# Patient Record
Sex: Male | Born: 1980 | Race: White | Hispanic: No | Marital: Single | Smoking: Current every day smoker
Health system: Southern US, Community
[De-identification: ages and names within clinical notes are randomized; demographics above are authoritative.]

## PROBLEM LIST (undated history)

## (undated) DIAGNOSIS — F32A Depression, unspecified: Secondary | ICD-10-CM

## (undated) DIAGNOSIS — R202 Paresthesia of skin: Secondary | ICD-10-CM

## (undated) DIAGNOSIS — G4700 Insomnia, unspecified: Secondary | ICD-10-CM

## (undated) DIAGNOSIS — F419 Anxiety disorder, unspecified: Secondary | ICD-10-CM

## (undated) DIAGNOSIS — M60062 Infective myositis, left lower leg: Secondary | ICD-10-CM

## (undated) DIAGNOSIS — F1910 Other psychoactive substance abuse, uncomplicated: Secondary | ICD-10-CM

## (undated) DIAGNOSIS — B199 Unspecified viral hepatitis without hepatic coma: Secondary | ICD-10-CM

## (undated) HISTORY — DX: Depression, unspecified: F32.A

## (undated) HISTORY — DX: Anxiety disorder, unspecified: F41.9

## (undated) HISTORY — DX: Paresthesia of skin: R20.2

## (undated) HISTORY — DX: Infective myositis, left lower leg: M60.062

## (undated) HISTORY — DX: Insomnia, unspecified: G47.00

---

## 2007-09-21 ENCOUNTER — Emergency Department (HOSPITAL_COMMUNITY): Admission: EM | Admit: 2007-09-21 | Discharge: 2007-09-21 | Payer: Self-pay | Admitting: Emergency Medicine

## 2011-01-07 ENCOUNTER — Emergency Department (HOSPITAL_COMMUNITY)
Admission: EM | Admit: 2011-01-07 | Discharge: 2011-01-07 | Disposition: A | Discharge status: Home / Self Care | Payer: No Typology Code available for payment source | Attending: Emergency Medicine | Admitting: Emergency Medicine

## 2011-01-07 ENCOUNTER — Emergency Department (HOSPITAL_COMMUNITY): Payer: Self-pay

## 2011-01-07 DIAGNOSIS — R112 Nausea with vomiting, unspecified: Secondary | ICD-10-CM | POA: Insufficient documentation

## 2011-01-07 DIAGNOSIS — Z79899 Other long term (current) drug therapy: Secondary | ICD-10-CM | POA: Insufficient documentation

## 2011-01-07 DIAGNOSIS — R197 Diarrhea, unspecified: Secondary | ICD-10-CM | POA: Insufficient documentation

## 2011-01-07 DIAGNOSIS — R404 Transient alteration of awareness: Secondary | ICD-10-CM | POA: Insufficient documentation

## 2011-01-07 DIAGNOSIS — R413 Other amnesia: Secondary | ICD-10-CM | POA: Insufficient documentation

## 2011-01-07 DIAGNOSIS — R61 Generalized hyperhidrosis: Secondary | ICD-10-CM | POA: Insufficient documentation

## 2011-01-07 LAB — POCT I-STAT, CHEM 8
BUN: 17 mg/dL (ref 6–23)
Creatinine, Ser: 1 mg/dL (ref 0.50–1.35)
Hemoglobin: 15 g/dL (ref 13.0–17.0)
Potassium: 3.5 mEq/L (ref 3.5–5.1)
Sodium: 142 mEq/L (ref 135–145)
TCO2: 24 mmol/L (ref 0–100)

## 2011-01-07 LAB — CBC
Hemoglobin: 14.1 g/dL (ref 13.0–17.0)
MCH: 30.3 pg (ref 26.0–34.0)
MCHC: 34.5 g/dL (ref 30.0–36.0)
Platelets: 192 10*3/uL (ref 150–400)
RBC: 4.65 MIL/uL (ref 4.22–5.81)

## 2011-01-07 LAB — CK TOTAL AND CKMB (NOT AT ARMC)
CK, MB: 2.4 ng/mL (ref 0.3–4.0)
Relative Index: 2.3 (ref 0.0–2.5)
Total CK: 105 U/L (ref 7–232)

## 2011-04-04 LAB — COMPREHENSIVE METABOLIC PANEL
AST: 30
Albumin: 4.1
BUN: 10
Calcium: 9.2
Creatinine, Ser: 1.16
GFR calc Af Amer: >60
Total Protein: 6.5

## 2011-04-04 LAB — DIFFERENTIAL
Basophils Absolute: 0.1
Lymphocytes Relative: 10 — ABNORMAL LOW
Lymphs Abs: 1.1
Monocytes Absolute: 1
Monocytes Relative: 9
Neutro Abs: 8.9 — ABNORMAL HIGH

## 2011-04-04 LAB — CBC
HCT: 41.7
MCV: 91.2
Platelets: 208
RDW: 12.6

## 2011-04-04 LAB — RAPID URINE DRUG SCREEN, HOSP PERFORMED
Amphetamines: NOT DETECTED
Benzodiazepines: NOT DETECTED
Cocaine: NOT DETECTED
Tetrahydrocannabinol: POSITIVE — AB

## 2019-12-25 ENCOUNTER — Inpatient Hospital Stay (HOSPITAL_COMMUNITY)
Admission: EM | Admit: 2019-12-25 | Discharge: 2019-12-30 | DRG: 871 | Disposition: A | Discharge status: Home / Self Care | Payer: Self-pay | Source: Other Acute Inpatient Hospital | Attending: Internal Medicine | Admitting: Internal Medicine

## 2019-12-25 ENCOUNTER — Other Ambulatory Visit: Payer: Self-pay

## 2019-12-25 ENCOUNTER — Encounter (HOSPITAL_COMMUNITY): Payer: Self-pay | Admitting: Family Medicine

## 2019-12-25 DIAGNOSIS — J189 Pneumonia, unspecified organism: Secondary | ICD-10-CM | POA: Diagnosis present

## 2019-12-25 DIAGNOSIS — E871 Hypo-osmolality and hyponatremia: Secondary | ICD-10-CM | POA: Diagnosis present

## 2019-12-25 DIAGNOSIS — F1120 Opioid dependence, uncomplicated: Secondary | ICD-10-CM | POA: Diagnosis present

## 2019-12-25 DIAGNOSIS — R748 Abnormal levels of other serum enzymes: Secondary | ICD-10-CM

## 2019-12-25 DIAGNOSIS — J9601 Acute respiratory failure with hypoxia: Secondary | ICD-10-CM | POA: Diagnosis present

## 2019-12-25 DIAGNOSIS — R7401 Elevation of levels of liver transaminase levels: Secondary | ICD-10-CM

## 2019-12-25 DIAGNOSIS — Z888 Allergy status to other drugs, medicaments and biological substances status: Secondary | ICD-10-CM

## 2019-12-25 DIAGNOSIS — J188 Other pneumonia, unspecified organism: Secondary | ICD-10-CM | POA: Diagnosis present

## 2019-12-25 DIAGNOSIS — A419 Sepsis, unspecified organism: Principal | ICD-10-CM | POA: Diagnosis present

## 2019-12-25 DIAGNOSIS — M60004 Infective myositis, unspecified left leg: Secondary | ICD-10-CM | POA: Diagnosis present

## 2019-12-25 DIAGNOSIS — D649 Anemia, unspecified: Secondary | ICD-10-CM | POA: Diagnosis not present

## 2019-12-25 DIAGNOSIS — M60009 Infective myositis, unspecified site: Secondary | ICD-10-CM | POA: Diagnosis present

## 2019-12-25 DIAGNOSIS — F1910 Other psychoactive substance abuse, uncomplicated: Secondary | ICD-10-CM | POA: Diagnosis present

## 2019-12-25 DIAGNOSIS — F17210 Nicotine dependence, cigarettes, uncomplicated: Secondary | ICD-10-CM | POA: Diagnosis present

## 2019-12-25 DIAGNOSIS — E876 Hypokalemia: Secondary | ICD-10-CM | POA: Diagnosis not present

## 2019-12-25 DIAGNOSIS — M79605 Pain in left leg: Secondary | ICD-10-CM

## 2019-12-25 DIAGNOSIS — Z79899 Other long term (current) drug therapy: Secondary | ICD-10-CM

## 2019-12-25 DIAGNOSIS — R7989 Other specified abnormal findings of blood chemistry: Secondary | ICD-10-CM | POA: Diagnosis present

## 2019-12-25 DIAGNOSIS — Z20822 Contact with and (suspected) exposure to covid-19: Secondary | ICD-10-CM | POA: Diagnosis present

## 2019-12-25 HISTORY — DX: Opioid dependence, uncomplicated: F11.20

## 2019-12-25 HISTORY — DX: Elevation of levels of liver transaminase levels: R74.01

## 2019-12-25 HISTORY — DX: Unspecified viral hepatitis without hepatic coma: B19.9

## 2019-12-25 HISTORY — DX: Other psychoactive substance abuse, uncomplicated: F19.10

## 2019-12-25 LAB — LACTIC ACID, PLASMA: Lactic Acid, Venous: 1.6 mmol/L (ref 0.5–1.9)

## 2019-12-25 MED ORDER — HYDROMORPHONE HCL 1 MG/ML IJ SOLN
0.5000 mg | INTRAMUSCULAR | Status: DC | PRN
  Filled 2019-12-25: qty 0.5

## 2019-12-25 MED ORDER — HYDROMORPHONE HCL 1 MG/ML IJ SOLN: 1.0000 mg | Freq: Once | INTRAMUSCULAR | Status: DC | PRN

## 2019-12-25 MED ORDER — BUPRENORPHINE HCL-NALOXONE HCL 8-2 MG SL SUBL
1.0000 | SUBLINGUAL_TABLET | Freq: Two times a day (BID) | SUBLINGUAL | Status: DC
  Administered 2019-12-27 – 2019-12-30 (×7): 1 via SUBLINGUAL
  Filled 2019-12-25 (×7): qty 1

## 2019-12-25 MED ORDER — SODIUM CHLORIDE 0.9 % IV SOLN: INTRAVENOUS | Status: AC

## 2019-12-25 MED ORDER — ONDANSETRON HCL 4 MG/2ML IJ SOLN
4.0000 mg | Freq: Four times a day (QID) | INTRAMUSCULAR | Status: DC | PRN
  Administered 2019-12-28 – 2019-12-29 (×4): 4 mg via INTRAVENOUS
  Filled 2019-12-25 (×4): qty 2

## 2019-12-25 MED ORDER — ONDANSETRON HCL 4 MG PO TABS
4.0000 mg | ORAL_TABLET | Freq: Four times a day (QID) | ORAL | Status: DC | PRN
  Administered 2019-12-28 – 2019-12-30 (×3): 4 mg via ORAL
  Filled 2019-12-25 (×3): qty 1

## 2019-12-25 MED ORDER — HYDROMORPHONE HCL 1 MG/ML IJ SOLN
0.5000 mg | INTRAMUSCULAR | Status: DC | PRN
  Administered 2019-12-25: 0.5 mg via INTRAVENOUS
  Administered 2019-12-26 (×6): 1 mg via INTRAVENOUS
  Filled 2019-12-25 (×6): qty 1

## 2019-12-25 MED ORDER — PIPERACILLIN-TAZOBACTAM 3.375 G IVPB
3.3750 g | Freq: Three times a day (TID) | INTRAVENOUS | Status: DC
  Administered 2019-12-25 – 2019-12-30 (×14): 3.375 g via INTRAVENOUS
  Filled 2019-12-25 (×13): qty 50

## 2019-12-25 MED ORDER — ENOXAPARIN SODIUM 40 MG/0.4ML ~~LOC~~ SOLN
40.0000 mg | SUBCUTANEOUS | Status: DC
  Administered 2019-12-25 – 2019-12-26 (×2): 40 mg via SUBCUTANEOUS
  Filled 2019-12-25 (×4): qty 0.4

## 2019-12-25 MED ORDER — MIRTAZAPINE 15 MG PO TABS
30.0000 mg | ORAL_TABLET | Freq: Every day | ORAL | Status: DC
  Administered 2019-12-25 – 2019-12-29 (×5): 30 mg via ORAL
  Filled 2019-12-25 (×5): qty 2

## 2019-12-25 MED ORDER — BUSPIRONE HCL 10 MG PO TABS
20.0000 mg | ORAL_TABLET | Freq: Two times a day (BID) | ORAL | Status: DC
  Administered 2019-12-25 – 2019-12-30 (×10): 20 mg via ORAL
  Filled 2019-12-25: qty 4
  Filled 2019-12-25 (×10): qty 2

## 2019-12-25 MED ORDER — SENNOSIDES-DOCUSATE SODIUM 8.6-50 MG PO TABS: 1.0000 | ORAL_TABLET | Freq: Every evening | ORAL | Status: DC | PRN

## 2019-12-25 MED ORDER — ACETAMINOPHEN 650 MG RE SUPP: 650.0000 mg | Freq: Four times a day (QID) | RECTAL | Status: DC | PRN

## 2019-12-25 MED ORDER — VANCOMYCIN HCL IN DEXTROSE 1-5 GM/200ML-% IV SOLN
1000.0000 mg | Freq: Three times a day (TID) | INTRAVENOUS | Status: DC
  Administered 2019-12-26 – 2019-12-27 (×4): 1000 mg via INTRAVENOUS
  Filled 2019-12-25 (×6): qty 200

## 2019-12-25 MED ORDER — SODIUM CHLORIDE 0.9% FLUSH
3.0000 mL | Freq: Two times a day (BID) | INTRAVENOUS | Status: DC
  Administered 2019-12-25 – 2019-12-30 (×7): 3 mL via INTRAVENOUS

## 2019-12-25 MED ORDER — ACETAMINOPHEN 325 MG PO TABS
650.0000 mg | ORAL_TABLET | Freq: Four times a day (QID) | ORAL | Status: DC | PRN
  Administered 2019-12-26 – 2019-12-30 (×8): 650 mg via ORAL
  Filled 2019-12-25 (×9): qty 2

## 2019-12-25 MED ORDER — FLUOXETINE HCL 20 MG PO CAPS
40.0000 mg | ORAL_CAPSULE | Freq: Every morning | ORAL | Status: DC
  Administered 2019-12-26 – 2019-12-30 (×5): 40 mg via ORAL
  Filled 2019-12-25 (×5): qty 2

## 2019-12-25 NOTE — Progress Notes (Signed)
Pharmacy Antibiotic Note  Daniel Trujillo is a 39 y.o. male admitted on 12/25/2019 with Infectious myofascitis.  Pharmacy has been consulted for Vanco, Zosyn dosing.  CC/HPI: Transfer from [place] c/o L knee pain with swelling and redness. Noted AST 1432, ALT 336, Tbili 1.2, LA 3, PC 1.78, Ddimer 3274 - CT at [facility]: Infectious myofascitis  PMH: depression, anxiety, substance use disorder, tobacco  Significant events:  Home meds: Buspar, Prozac, Remeron  ID: Infectious myofascitis.  WBC 17.3. Scr 0.9. PC 1.78 at [place]. LA 3 elevated.   Plan: Vanco 2g IV 1549 at OSH, then 1g IV q8hr. Vanco trough after 3-5 doses at steady state. Zosyn 3.375g IV 1509, then 3.375g IV q8hr      Height: 6\' 2"  (188 cm) Weight: 77.7 kg (171 lb 3.2 oz) IBW/kg (Calculated) : 82.2  Temp (24hrs), Avg:99.8 F (37.7 C), Min:99.8 F (37.7 C), Max:99.8 F (37.7 C)  No results for input(s): WBC, CREATININE, LATICACIDVEN, VANCOTROUGH, VANCOPEAK, VANCORANDOM, GENTTROUGH, GENTPEAK, GENTRANDOM, TOBRATROUGH, TOBRAPEAK, TOBRARND, AMIKACINPEAK, AMIKACINTROU, AMIKACIN in the last 168 hours.  CrCl cannot be calculated (Patient's most recent lab result is older than the maximum 21 days allowed.).    Allergies  Allergen Reactions  . Naproxen Rash and Swelling    Other reaction(s): Joint Pain  . Carbamazepine Hives    Tene Gato S. , PharmD, BCPS Clinical Staff Pharmacist [email or web address] Merilynn [place] 12/25/2019 10:02 PM

## 2019-12-25 NOTE — Progress Notes (Signed)
Pt has arrived to the unit from [facility].  Pt complains of pain 9 out of 10.   [facility] reports last pain medication administered at 1920 by RN at [place] prior to transport.

## 2019-12-25 NOTE — H&P (Addendum)
History and Physical    Daniel Trujillo [id number] DOB: July 24, 1980 DOA: 12/25/2019  PCP: Carlus Pavlov, MD   Patient coming from: Home   Chief Complaint: Fever, left leg redness/pain/swelling  HPI: Daniel Trujillo is a 39 y.o. male with medical history significant for IV drug abuse and opiate dependence on Suboxone, now presenting to emergency department for evaluation of fevers with pain, redness, and swelling involving the left leg.  Patient reports he been in his usual state of health until this morning when he noticed pain in the back of his left leg.  He noticed redness and swelling behind the left knee, denies any recent trauma or precipitating event that he can identify.  Reports fever at home for which he took acetaminophen.  Denies any chest pain or cough.  Reports that he injected heroin 2 days ago but is trying to quit.  Reports that he has a history of hepatitis that was treated around 2018 and he denies any recent abdominal pain, vomiting, or diarrhea.  [facility] ED Course: Upon arrival to the ED, patient is found to be febrile, tachycardic in the 120s, stable blood pressure, and new supplemental oxygen requirement.  Chest x-ray is notable for new infiltrates involving the right lower lobe and likely left upper lobe.  CT of the left lower extremity demonstrates patchy hypodense appearance of the distal semimembranosus and proximal gastrocnemius musculature with inflammatory stranding but no abscess or air.  Chemistry panel features a sodium of 130, elevated BUN to creatinine ratio, and elevated transaminases.  CBC with leukocytosis to 17,300.  Lactic acid is elevated to 3.0, CRP elevated to 225, procalcitonin elevated to 1.78, and D-dimer is elevated at 340.  UDS was positive for opiates.  Patient was given 2 L of saline, pain control, vancomycin, and Zosyn in the ED.  Orthopedic surgery was consulted and evaluated the patient in the emergency department, recommended hospitalist  admission for IV antibiotics and close monitoring.  Hospitalist at [place] recommended transfer to a facility with an ICU in case the patient worsens.  He was transferred to [facility] for ongoing evaluation and management. COVID-19 screening test was negative at [facility] prior to transfer.   Review of Systems:  All other systems reviewed and apart from HPI, are negative.  Past Medical History:  Diagnosis Date  . IV drug abuse ([place])   . Viral hepatitis     History reviewed. No pertinent surgical history.   reports that he has been smoking. He has never used smokeless tobacco. He reports previous alcohol use. He reports current drug use. Drug: Heroin.  Allergies  Allergen Reactions  . Naproxen Rash and Swelling    Other reaction(s): Joint Pain  . Carbamazepine Hives    Family History  Problem Relation Age of Onset  . Hypertension Mother   . Stroke Maternal Grandmother      Prior to Admission medications   Medication Sig Start Date End Date Taking? Authorizing Provider  buprenorphine-naloxone (SUBOXONE) 8-2 mg SUBL SL tablet Place 1 tablet under the tongue 2 (two) times daily. 10/23/19  Yes [provider]  busPIRone (BUSPAR) 10 MG tablet Take 20 mg by mouth 2 (two) times daily. 11/25/19  Yes [provider]  FLUoxetine (PROZAC) 40 MG capsule Take 40 mg by mouth every morning. 11/25/19  Yes [provider]  mirtazapine (REMERON) 30 MG tablet Take 30 mg by mouth at bedtime. 11/25/19  Yes [provider]    Physical Exam: Vitals:   12/25/19 2045  BP:  140/80  Pulse: 95  Resp: 16  Temp: 99.8 F (37.7 C)  TempSrc: Oral  SpO2: 93%  Weight: 77.7 kg  Height: 6\' 2"  (1.88 m)    Constitutional: NAD, calm  Eyes: PERTLA, lids and conjunctivae normal ENMT: Mucous membranes are moist. Posterior pharynx clear of any exudate or lesions.   Neck: normal, supple, no masses, no thyromegaly Respiratory: no wheezing, no crackles. Mild tachypnea. No  accessory muscle use.  Cardiovascular: Rate ~110 and regular. No extremity edema.  Abdomen: No distension, no tenderness, soft. Bowel sounds active.  Musculoskeletal: no clubbing / cyanosis. No joint deformity upper and lower extremities.   Skin: Popliteal fossa with erythema, tenderness, and warmth but no fluctuance, drainage, or crepitus. Warm, dry, well-perfused. Neurologic: CN 2-12 grossly intact. Sensation intact. Moving all extremities.  Psychiatric: Alert and oriented to person, place, and situation. Calm and cooperative.    Labs and Imaging on Admission: I have personally reviewed following labs and imaging studies  CBC: No results for input(s): WBC, NEUTROABS, HGB, HCT, MCV, PLT in the last 168 hours. Basic Metabolic Panel: No results for input(s): NA, K, CL, CO2, GLUCOSE, BUN, CREATININE, CALCIUM, MG, PHOS in the last 168 hours. GFR: CrCl cannot be calculated (Patient's most recent lab result is older than the maximum 21 days allowed.). Liver Function Tests: No results for input(s): AST, ALT, ALKPHOS, BILITOT, PROT, ALBUMIN in the last 168 hours. No results for input(s): LIPASE, AMYLASE in the last 168 hours. No results for input(s): AMMONIA in the last 168 hours. Coagulation Profile: No results for input(s): INR, PROTIME in the last 168 hours. Cardiac Enzymes: No results for input(s): CKTOTAL, CKMB, CKMBINDEX, TROPONINI in the last 168 hours. BNP (last 3 results) No results for input(s): PROBNP in the last 8760 hours. HbA1C: No results for input(s): HGBA1C in the last 72 hours. CBG: No results for input(s): GLUCAP in the last 168 hours. Lipid Profile: No results for input(s): CHOL, HDL, LDLCALC, TRIG, CHOLHDL, LDLDIRECT in the last 72 hours. Thyroid Function Tests: No results for input(s): TSH, T4TOTAL, FREET4, T3FREE, THYROIDAB in the last 72 hours. Anemia Panel: No results for input(s): VITAMINB12, FOLATE, FERRITIN, TIBC, IRON, RETICCTPCT in the last 72 hours. Urine  analysis: No results found for: COLORURINE, APPEARANCEUR, LABSPEC, PHURINE, GLUCOSEU, HGBUR, BILIRUBINUR, KETONESUR, PROTEINUR, UROBILINOGEN, NITRITE, LEUKOCYTESUR Sepsis Labs: @LABRCNTIP (procalcitonin:4,lacticidven:4) )No results found for this or any previous visit (from the past 240 hour(s)).   Radiological Exams on Admission: No results found.   Assessment/Plan   1. Sepsis secondary to soft tissue infection  - IV drug abuser presents with one day of fever with pain, swelling, and erythema involving left popliteal fossa and extending proximally  - He was found to be febrile with tachycardia, leukocytosis, and lactate of 3.0  - CT findings concerning for myofasciitis involving semimembranous and proximal gastrocnemius muscles without abscess or gas  - He was given 2 liters NS in ED and started on vancomycin and Zosyn  - Orthopedic surgery saw the patient at [facility] and recommended IV antibiotics and close observation but hospitalist there recommended transfer to facility with an ICU should he worsen  - Continue vancomycin and Zosyn, follow cultures, trend inflammatory markers    2. Acute hypoxic respiratory failure; multifocal pneumonia; elevated d-dimer  - IV drug abuser presenting with fevers and pain/swelling/redness involving LLE and is found to have new supplemental O2 requirement, multifocal infiltrates on CXR, and d-dimer 3.4  - He denies cough or chest pain  - CTA chest to better  characterize CXR findings and rule-out PE, check sputum culture, continue broad-spectrum antibiotics, follow blood cultures, consider echo if blood cultures positive or CTA findings concerning for septic emboli   3. Elevated transaminases  - AST 1400 and ALT 340 in ED  - No abdominal pain or GI complaints, reports being treated for viral hepatitis in 2018  - Check acute viral hepatitis panel, trend levels    4. IV drug abuse; opiate dependence  - Patient reports taking Suboxone and trying to quit  heroin (last injected 12/23/19)  - Continue Suboxone  - He is admitted with an acutely painful condition and is aware that pain-control will be difficult due to his opiate tolerance and allergy to NSAID     DVT prophylaxis: Lovenox  Code Status: Full  Family Communication: Discussed with patient  Disposition Plan:  Patient is from: Home  Anticipated d/c is to: Home  Anticipated d/c date is: 12/28/19 Patient currently: Has sepsis secondary to soft-tissue infection requiring IV antibiotics and close monitoring  Consults called: None  Admission status: Inpatient    Briscoe Deutscher, MD [facility] Pager: See [email or web address]  If 7AM-7PM, please contact the daytime attending [email or web address]  12/25/2019, 10:25 PM

## 2019-12-26 ENCOUNTER — Inpatient Hospital Stay (HOSPITAL_COMMUNITY): Payer: Self-pay

## 2019-12-26 DIAGNOSIS — R748 Abnormal levels of other serum enzymes: Secondary | ICD-10-CM

## 2019-12-26 DIAGNOSIS — J189 Pneumonia, unspecified organism: Secondary | ICD-10-CM

## 2019-12-26 DIAGNOSIS — M60004 Infective myositis, unspecified left leg: Secondary | ICD-10-CM

## 2019-12-26 DIAGNOSIS — F1910 Other psychoactive substance abuse, uncomplicated: Secondary | ICD-10-CM

## 2019-12-26 LAB — COMPREHENSIVE METABOLIC PANEL
ALT: 254 U/L — ABNORMAL HIGH (ref 0–44)
AST: 687 U/L — ABNORMAL HIGH (ref 15–41)
Albumin: 2.8 g/dL — ABNORMAL LOW (ref 3.5–5.0)
Alkaline Phosphatase: 58 U/L (ref 38–126)
Anion gap: 7 (ref 5–15)
BUN: 9 mg/dL (ref 6–20)
CO2: 28 mmol/L (ref 22–32)
Calcium: 8.2 mg/dL — ABNORMAL LOW (ref 8.9–10.3)
Chloride: 101 mmol/L (ref 98–111)
Creatinine, Ser: 1.04 mg/dL (ref 0.61–1.24)
GFR calc Af Amer: >60 mL/min (ref >60)
GFR calc non Af Amer: >60 mL/min (ref >60)
Glucose, Bld: 132 mg/dL — ABNORMAL HIGH (ref 70–99)
Potassium: 3.6 mmol/L (ref 3.5–5.1)
Sodium: 136 mmol/L (ref 135–145)
Total Bilirubin: 0.9 mg/dL (ref 0.3–1.2)
Total Protein: 6.1 g/dL — ABNORMAL LOW (ref 6.5–8.1)

## 2019-12-26 LAB — CBC WITH DIFFERENTIAL/PLATELET
Abs Immature Granulocytes: 0.05 10*3/uL (ref 0.00–0.07)
Basophils Absolute: 0 10*3/uL (ref 0.0–0.1)
Basophils Relative: 0 %
Eosinophils Absolute: 0 10*3/uL (ref 0.0–0.5)
Eosinophils Relative: 0 %
HCT: 35.6 % — ABNORMAL LOW (ref 39.0–52.0)
Hemoglobin: 12 g/dL — ABNORMAL LOW (ref 13.0–17.0)
Immature Granulocytes: 0 %
Lymphocytes Relative: 12 %
Lymphs Abs: 1.5 10*3/uL (ref 0.7–4.0)
MCH: 30.5 pg (ref 26.0–34.0)
MCHC: 33.7 g/dL (ref 30.0–36.0)
MCV: 90.6 fL (ref 80.0–100.0)
Monocytes Absolute: 1.4 10*3/uL — ABNORMAL HIGH (ref 0.1–1.0)
Monocytes Relative: 11 %
Neutro Abs: 9.9 10*3/uL — ABNORMAL HIGH (ref 1.7–7.7)
Neutrophils Relative %: 77 %
Platelets: 179 10*3/uL (ref 150–400)
RBC: 3.93 MIL/uL — ABNORMAL LOW (ref 4.22–5.81)
RDW: 12.9 % (ref 11.5–15.5)
WBC: 12.9 10*3/uL — ABNORMAL HIGH (ref 4.0–10.5)
nRBC: 0 % (ref 0.0–0.2)

## 2019-12-26 LAB — PROTIME-INR
INR: 1.1 (ref 0.8–1.2)
Prothrombin Time: 14.1 seconds (ref 11.4–15.2)

## 2019-12-26 LAB — EXPECTORATED SPUTUM ASSESSMENT W GRAM STAIN, RFLX TO RESP C

## 2019-12-26 LAB — STREP PNEUMONIAE URINARY ANTIGEN: Strep Pneumo Urinary Antigen: NEGATIVE

## 2019-12-26 LAB — SEDIMENTATION RATE: Sed Rate: 46 mm/hr — ABNORMAL HIGH (ref 0–16)

## 2019-12-26 LAB — HEPATITIS PANEL, ACUTE
HCV Ab: REACTIVE — AB
Hep A IgM: NONREACTIVE
Hep B C IgM: NONREACTIVE
Hepatitis B Surface Ag: NONREACTIVE

## 2019-12-26 LAB — C-REACTIVE PROTEIN: CRP: 20.5 mg/dL — ABNORMAL HIGH (ref <1.0)

## 2019-12-26 LAB — HIV ANTIBODY (ROUTINE TESTING W REFLEX): HIV Screen 4th Generation wRfx: NONREACTIVE

## 2019-12-26 LAB — SARS CORONAVIRUS 2 BY RT PCR (HOSPITAL ORDER, PERFORMED IN ~~LOC~~ HOSPITAL LAB): SARS Coronavirus 2: NEGATIVE

## 2019-12-26 MED ORDER — NICOTINE 14 MG/24HR TD PT24
14.0000 mg | MEDICATED_PATCH | Freq: Every day | TRANSDERMAL | Status: DC
  Administered 2019-12-26 – 2019-12-30 (×5): 14 mg via TRANSDERMAL
  Filled 2019-12-26 (×5): qty 1

## 2019-12-26 MED ORDER — IOHEXOL 350 MG/ML SOLN
75.0000 mL | Freq: Once | INTRAVENOUS | Status: AC | PRN
  Administered 2019-12-26: 75 mL via INTRAVENOUS

## 2019-12-26 NOTE — Plan of Care (Signed)
°  Problem: Coping: °Goal: Level of anxiety will decrease °Outcome: Progressing °  °

## 2019-12-26 NOTE — Progress Notes (Signed)
PROGRESS NOTE    Daniel Trujillo  YSA:[id number] DOB: January 12, 1981 DOA: 12/25/2019 PCP: Carlus Pavlov, MD   No chief complaint on file.   Brief Narrative:  39 year old male prior history of IV drug abuse, opiate dependence on Suboxone presents to ED for evaluation of fever associated with pain and redness of the left lower extremity.  Patient was seen at [facility] center and transferred to [facility] for further evaluation and management of myositis.  CT of the left lower extremity demonstrate patchy hypodense appearance of the distal semimembranous and proximal gastrocnemius musculature with inflammatory stranding without any abscess or air.  CT angiogram of the chest shows groundglass opacities in the bilateral lungs consistent with atypical pneumonia. Covid test was negative.  Patient was started on broad-spectrum IV antibiotics.   Assessment & Plan:   Principal Problem:   Infectious myositis Active Problems:   Sepsis ([place])   Multifocal pneumonia   IV drug abuse ([place])   Opiate dependence ([place])   Elevated liver transaminase level   Positive D dimer   Hyponatremia  Sepsis secondary to myofascitis involving semimembranosus and proximal gastrocnemius muscles, present on admission. In the setting of IV drug abuse. Patient was febrile on admission with elevated lactic acid, tachycardic and leukocytosis.  Sepsis physiology improving.  Patient is currently afebrile, improving leukocytosis, lactic acid has normalized. Continue with broad-spectrum IV antibiotics at this time and trend inflammatory markers.  Follow blood cultures.     Acute hypoxic respiratory failure with elevated D-dimer and CT angiogram suspicious for atypical pneumonia. Sputum cultures are pending. Continue broad-spectrum IV antibiotics. Follow blood cultures.    Elevated transaminases, no symptoms of nausea or vomiting or abdominal pain. Patient has a history of HCV, reports being treated in  2018.   History of IV drug abuse with opiate dependence on Suboxone. Continue with Suboxone.   DVT prophylaxis: Lovenox.  Code Status: full code.  Family Communication: none at bedside.  Disposition:   Status is: Inpatient  Remains inpatient appropriate because:IV treatments appropriate due to intensity of illness or inability to take PO   Dispo: The patient is from: Home              Anticipated d/c is to: Home              Anticipated d/c date is: 2 days              Patient currently is not medically stable to d/c.       Consultants:   None.    Procedures: none.   Antimicrobials:  Anti-infectives (From admission, onward)   Start     Dose/Rate Route Frequency Ordered Stop   12/26/19 0000  vancomycin (VANCOCIN) IVPB 1000 mg/200 mL premix     Discontinue     1,000 mg 200 mL/hr over 60 Minutes Intravenous Every 8 hours 12/25/19 2201     12/25/19 2300  piperacillin-tazobactam (ZOSYN) IVPB 3.375 g     Discontinue     3.375 g 12.5 mL/hr over 240 Minutes Intravenous Every 8 hours 12/25/19 2201            Subjective: Pain better controlled.   Objective: Vitals:   12/25/19 2045 12/26/19 0006 12/26/19 0330 12/26/19 0710  BP: 140/80 138/84 134/76 132/78  Pulse: 95 89 93 87  Resp: 16 14 16 18   Temp: 99.8 F (37.7 C) (!) 100.4 F (38 C) 99.9 F (37.7 C) 99.8 F (37.7 C)  TempSrc: Oral Oral Oral Oral  SpO2: 93%  100% 96% 96%  Weight: 77.7 kg  76.8 kg   Height: 6\' 2"  (1.88 m)       Intake/Output Summary (Last 24 hours) at 12/26/2019 1135 Last data filed at 12/26/2019 0807 Gross per 24 hour  Intake 1600.93 ml  Output 1400 ml  Net 200.93 ml   Filed Weights   12/25/19 2045 12/26/19 0330  Weight: 77.7 kg 76.8 kg    Examination:  General exam: Appears calm and comfortable  Respiratory system: Clear to auscultation. Respiratory effort normal. Cardiovascular system: S1 & S2 heard, RRR. No JVD, murmurs, rubs, gallops or clicks. No pedal  edema. Gastrointestinal system: Abdomen is nondistended, soft and nontender.Normal bowel sounds heard. Central nervous system: Alert and oriented. No focal neurological deficits. Extremities: erythema, tenderness and swelling over the inner aspect of the lleft thigh and back of the knee on the left.  Skin:  See above,  Psychiatry:  Mood & affect appropriate.     Data Reviewed: I have personally reviewed following labs and imaging studies  CBC: Recent Labs  Lab 12/26/19 0305  WBC 12.9*  NEUTROABS 9.9*  HGB 12.0*  HCT 35.6*  MCV 90.6  PLT 179    Basic Metabolic Panel: Recent Labs  Lab 12/26/19 0305  NA 136  K 3.6  CL 101  CO2 28  GLUCOSE 132*  BUN 9  CREATININE 1.04  CALCIUM 8.2*    GFR: Estimated Creatinine Clearance: 106.7 mL/min (by C-G formula based on SCr of 1.04 mg/dL).  Liver Function Tests: Recent Labs  Lab 12/26/19 0305  AST 687*  ALT 254*  ALKPHOS 58  BILITOT 0.9  PROT 6.1*  ALBUMIN 2.8*    CBG: No results for input(s): GLUCAP in the last 168 hours.   Recent Results (from the past 240 hour(s))  Culture, blood (routine x 2)     Status: None (Preliminary result)   Collection Time: 12/25/19 10:26 PM   Specimen: BLOOD LEFT HAND  Result Value Ref Range Status   Specimen Description BLOOD LEFT HAND  Final   Special Requests   Final    BOTTLES DRAWN AEROBIC AND ANAEROBIC Blood Culture adequate volume   Culture   Final    NO GROWTH < 12 HOURS Performed at [facility], [address] [place], [place]    Report Status PENDING  Incomplete  Culture, blood (routine x 2)     Status: None (Preliminary result)   Collection Time: 12/25/19 10:33 PM   Specimen: BLOOD  Result Value Ref Range Status   Specimen Description BLOOD RIGHT ANTECUBITAL  Final   Special Requests   Final    BOTTLES DRAWN AEROBIC AND ANAEROBIC Blood Culture adequate volume   Culture   Final    NO GROWTH < 12 HOURS Performed at [facility], [address] [place], [place]    Report Status PENDING  Incomplete  SARS Coronavirus 2 by RT PCR (hospital order, performed in [facility] hospital lab)     Status: None   Collection Time: 12/25/19 11:43 PM  Result Value Ref Range Status   SARS Coronavirus 2 NEGATIVE NEGATIVE Final    Comment: (NOTE) SARS-CoV-2 target nucleic acids are NOT DETECTED.  The SARS-CoV-2 RNA is generally detectable in upper and lower respiratory specimens during the acute phase of infection. The lowest concentration of SARS-CoV-2 viral copies this assay can detect is 250 copies / mL. A negative result does not preclude SARS-CoV-2 infection and should not be used  as the sole basis for treatment or other patient management decisions.  A negative result may occur with improper specimen collection / handling, submission of specimen other than nasopharyngeal swab, presence of viral mutation(s) within the areas targeted by this assay, and inadequate number of viral copies (<250 copies / mL). A negative result must be combined with clinical observations, patient history, and epidemiological information.  Fact Sheet for Patients:   BoilerBrush.com.cy  Fact Sheet for Healthcare Providers: https://pope.com/  This test is not yet approved or  cleared by the [place] FDA and has been authorized for detection and/or diagnosis of SARS-CoV-2 by FDA under an Emergency Use Authorization (EUA).  This EUA will remain in effect (meaning this test can be used) for the duration of the COVID-19 declaration under Section 564(b)(1) of the Act, 21 U.S.C. section 360bbb-3(b)(1), unless the authorization is terminated or revoked sooner.  Performed at [facility], [address] [place], [place] [address]          Radiology Studies: CT ANGIO CHEST PE W OR WO CONTRAST  Result Date: 12/26/2019 CLINICAL DATA:  Respiratory failure and IV drug abuse. EXAM: CT  ANGIOGRAPHY CHEST WITH CONTRAST TECHNIQUE: Multidetector CT imaging of the chest was performed using the standard protocol during bolus administration of intravenous contrast. Multiplanar CT image reconstructions and MIPs were obtained to evaluate the vascular anatomy. CONTRAST:  61mL OMNIPAQUE IOHEXOL 350 MG/ML SOLN COMPARISON:  Chest CTA report 11/02/2013 FINDINGS: Cardiovascular: Normal heart size. No pericardial effusion. No pulmonary artery filling defect when allowing for areas of mild motion artifact. Normal aorta. Mediastinum/Nodes: No adenopathy or mass. Lungs/Pleura: Ground-glass opacity in the perihilar and subpleural lungs. History of IV drug abuse with no cavitary or nodular components. No pulmonary edema or significant pleural effusion Upper Abdomen: Negative Musculoskeletal: No acute or aggressive finding Review of the MIP images confirms the above findings. IMPRESSION: 1. Negative for pulmonary embolism. 2. Patchy ground-glass opacity in the bilateral lung as seen with atypical pneumonia and ARDS. No nodular or cavitating airspace disease to correlate with IV drug abuse history. Electronically Signed   By: Marnee Spring M.D.   On: 12/26/2019 05:27        Scheduled Meds: . buprenorphine-naloxone  1 tablet Sublingual BID  . busPIRone  20 mg Oral BID  . enoxaparin (LOVENOX) injection  40 mg Subcutaneous Q24H  . FLUoxetine  40 mg Oral q morning - 10a  . mirtazapine  30 mg Oral QHS  . sodium chloride flush  3 mL Intravenous Q12H   Continuous Infusions: . piperacillin-tazobactam (ZOSYN)  IV 3.375 g (12/26/19 0626)  . vancomycin 1,000 mg (12/26/19 0805)     LOS: 1 day        Kathlen Mody, MD [facility]   To contact the attending provider between 7A-7P or the covering provider during after hours 7P-7A, please log into the web site [email or web address] and access using universal [place] password for that web site. If you do not have the password, please call the hospital  operator.  12/26/2019, 11:35 AM

## 2019-12-26 NOTE — TOC Initial Note (Addendum)
Transition of Care [facility]) - Initial/Assessment Note    Patient Details  Name: Daniel Trujillo MRN: [id number] Date of Birth: [birth year]  Transition of Care [facility]) CM/SW Contact:    Lockie Pares, RN Phone Number: 12/26/2019, 10:31 AM  Clinical Narrative:                 Patient admitted for Myositis infectious started on broad antibiotic therapy. Admits to Heroin use,states he  does want to stop, smoker, history of hepatitis Gave information on substance abuse programs in the area. Denies any current needs for home health. . Does have a PCP listed. Lives in [place].. Will follow for any needs.   Expected Discharge Plan: Home/Self Care Barriers to Discharge: Continued Medical Work up   Patient Goals and CMS Choice        Expected Discharge Plan and Services Expected Discharge Plan: Home/Self Care                                              Prior Living Arrangements/Services                       Activities of Daily Living Home Assistive Devices/Equipment: None ADL Screening (condition at time of admission) Patient's cognitive ability adequate to safely complete daily activities?: Yes Is the patient deaf or have difficulty hearing?: No Does the patient have difficulty seeing, even when wearing glasses/contacts?: No Does the patient have difficulty concentrating, remembering, or making decisions?: No Patient able to express need for assistance with ADLs?: No Does the patient have difficulty dressing or bathing?: No Independently performs ADLs?: Yes (appropriate for developmental age) Does the patient have difficulty walking or climbing stairs?: Yes Weakness of Legs: Left Weakness of Arms/Hands: None  Permission Sought/Granted                  Emotional Assessment Appearance:: Appears older than stated age     Orientation: : Oriented to Self, Oriented to Place, Oriented to  Time, Oriented to Situation Alcohol / Substance Use: Illicit  Drugs, Alcohol Use, Tobacco Use Psych Involvement: No (comment)  Admission diagnosis:  Sepsis (HCC) [A41.9] Patient Active Problem List   Diagnosis Date Noted  . Infectious myositis 12/25/2019  . Sepsis (HCC) 12/25/2019  . Multifocal pneumonia 12/25/2019  . IV drug abuse (HCC) 12/25/2019  . Opiate dependence (HCC) 12/25/2019  . Elevated liver transaminase level 12/25/2019  . Positive D dimer 12/25/2019  . Hyponatremia 12/25/2019   PCP:  Desmond Dike, MD Pharmacy:   [facility] [address], [place] - [address] [place] [address] Phone: [phone] Fax: [phone]     Social Determinants of Health (SDOH) Interventions    Readmission Risk Interventions No flowsheet data found.

## 2019-12-27 LAB — LEGIONELLA PNEUMOPHILA SEROGP 1 UR AG: L. pneumophila Serogp 1 Ur Ag: NEGATIVE

## 2019-12-27 LAB — COMPREHENSIVE METABOLIC PANEL
ALT: 222 U/L — ABNORMAL HIGH (ref 0–44)
AST: 363 U/L — ABNORMAL HIGH (ref 15–41)
Albumin: 2.7 g/dL — ABNORMAL LOW (ref 3.5–5.0)
Alkaline Phosphatase: 56 U/L (ref 38–126)
Anion gap: 9 (ref 5–15)
BUN: 12 mg/dL (ref 6–20)
CO2: 24 mmol/L (ref 22–32)
Calcium: 8.4 mg/dL — ABNORMAL LOW (ref 8.9–10.3)
Chloride: 105 mmol/L (ref 98–111)
Creatinine, Ser: 0.93 mg/dL (ref 0.61–1.24)
GFR calc Af Amer: >60 mL/min (ref >60)
GFR calc non Af Amer: >60 mL/min (ref >60)
Glucose, Bld: 152 mg/dL — ABNORMAL HIGH (ref 70–99)
Potassium: 3.3 mmol/L — ABNORMAL LOW (ref 3.5–5.1)
Sodium: 138 mmol/L (ref 135–145)
Total Bilirubin: 0.9 mg/dL (ref 0.3–1.2)
Total Protein: 6.4 g/dL — ABNORMAL LOW (ref 6.5–8.1)

## 2019-12-27 LAB — CBC WITH DIFFERENTIAL/PLATELET
Abs Immature Granulocytes: 0.05 10*3/uL (ref 0.00–0.07)
Basophils Absolute: 0.1 10*3/uL (ref 0.0–0.1)
Basophils Relative: 1 %
Eosinophils Absolute: 0.2 10*3/uL (ref 0.0–0.5)
Eosinophils Relative: 2 %
HCT: 36.5 % — ABNORMAL LOW (ref 39.0–52.0)
Hemoglobin: 12.1 g/dL — ABNORMAL LOW (ref 13.0–17.0)
Immature Granulocytes: 1 %
Lymphocytes Relative: 11 %
Lymphs Abs: 1.1 10*3/uL (ref 0.7–4.0)
MCH: 30.4 pg (ref 26.0–34.0)
MCHC: 33.2 g/dL (ref 30.0–36.0)
MCV: 91.7 fL (ref 80.0–100.0)
Monocytes Absolute: 0.9 10*3/uL (ref 0.1–1.0)
Monocytes Relative: 9 %
Neutro Abs: 8.3 10*3/uL — ABNORMAL HIGH (ref 1.7–7.7)
Neutrophils Relative %: 76 %
Platelets: 218 10*3/uL (ref 150–400)
RBC: 3.98 MIL/uL — ABNORMAL LOW (ref 4.22–5.81)
RDW: 13.1 % (ref 11.5–15.5)
WBC: 10.7 10*3/uL — ABNORMAL HIGH (ref 4.0–10.5)
nRBC: 0 % (ref 0.0–0.2)

## 2019-12-27 LAB — C-REACTIVE PROTEIN: CRP: 15.8 mg/dL — ABNORMAL HIGH (ref <1.0)

## 2019-12-27 LAB — VANCOMYCIN, TROUGH: Vancomycin Tr: 12 ug/mL — ABNORMAL LOW (ref 15–20)

## 2019-12-27 LAB — SEDIMENTATION RATE: Sed Rate: 72 mm/hr — ABNORMAL HIGH (ref 0–16)

## 2019-12-27 MED ORDER — POTASSIUM CHLORIDE CRYS ER 20 MEQ PO TBCR
30.0000 meq | EXTENDED_RELEASE_TABLET | Freq: Once | ORAL | Status: AC
  Administered 2019-12-27: 30 meq via ORAL
  Filled 2019-12-27: qty 1

## 2019-12-27 MED ORDER — VANCOMYCIN HCL 1250 MG/250ML IV SOLN
1250.0000 mg | Freq: Three times a day (TID) | INTRAVENOUS | Status: DC
  Administered 2019-12-27 – 2019-12-30 (×10): 1250 mg via INTRAVENOUS
  Filled 2019-12-27 (×11): qty 250

## 2019-12-27 NOTE — Evaluation (Signed)
Physical Therapy Evaluation Patient Details Name: Daniel Trujillo MRN: [id number] DOB: [birth year] Today's Date: 12/27/2019   History of Present Illness  39 y.o. male with medical history significant for IV drug abuse and opiate dependence on Suboxone, now presenting to emergency department for evaluation of fevers with pain, redness, and swelling involving the left leg. Reports that he injected heroin 2 days ago but is trying to quit. Pt found to have sepsis 2/2 soft tissue infection, concerning for myofasciitis. Pt also noted to have multifocal PNA.  Clinical Impression  Pt presents to PT with deficits in functional mobility, gait, strength, power, balance, endurance. Pt with L foot drop at this time, able to wiggle toes with limited dorsiflexion AROM noted. Pt is able to ambulate well without significant balance deviations, following PT cues to exaggerate knee and hip flexion to limit L foot drag. Pt will benefit from continued acute PT services to improve L ankle strength and to maintain ROM, and to aide in a return to independent mobility. PT currently recommends no acute PT follow-up at this time. Pt may benefit from trial of gait with crutches as these may be easier to utilize when negotiating stairs.    Follow Up Recommendations No PT follow up    Equipment Recommendations  None recommended by PT (may benefit from crutches, does own RW if needed)    Recommendations for Other Services       Precautions / Restrictions Precautions Precautions: Fall Restrictions Weight Bearing Restrictions: No      Mobility  Bed Mobility Overal bed mobility: Independent                Transfers Overall transfer level: Needs assistance Equipment used: None Transfers: Sit to/from Stand Sit to Stand: Supervision            Ambulation/Gait Ambulation/Gait assistance: Supervision Gait Distance (Feet): 300 Feet Assistive device: Rolling walker (2 wheeled) Gait Pattern/deviations:  Step-through pattern;Decreased dorsiflexion - left Gait velocity: functional Gait velocity interpretation: 1.31 - 2.62 ft/sec, indicative of limited community ambulator General Gait Details: pt with some L foot drop noted, PT encourages pt to exaggerate knee/hip flexion to avoid foot drag, otherwise steady step through gait pattern  Stairs            Wheelchair Mobility    Modified Rankin (Stroke Patients Only)       Balance Overall balance assessment: Mild deficits observed, not formally tested                                           Pertinent Vitals/Pain Pain Assessment: Faces Faces Pain Scale: Hurts a little bit Pain Location: L calf Pain Descriptors / Indicators: Sore Pain Intervention(s): Monitored during session    Home Living Family/patient expects to be discharged to:: Private residence Living Arrangements: Other relatives Available Help at Discharge: Family;Available 24 hours/day Type of Home: House Home Access: Stairs to enter Entrance Stairs-Rails: None Entrance Stairs-Number of Steps: 2 Home Layout: One level Home Equipment: Walker - 2 wheels      Prior Function Level of Independence: Independent         Comments: 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, newspaper        Extremity/Trunk Assessment   Upper Extremity Assessment Upper Extremity Assessment: Overall WFL for tasks assessed    Lower Extremity Assessment Lower Extremity Assessment: LLE deficits/detail  LLE Deficits / Details: L ankle PF 2/5, DF 2-/5, numbness from ankle to knee, numbness and tingling toes and foot LLE Sensation: decreased light touch    Cervical / Trunk Assessment Cervical / Trunk Assessment: Normal  Communication   Communication: No difficulties  Cognition Arousal/Alertness: Awake/alert Behavior During Therapy: WFL for tasks assessed/performed Overall Cognitive Status: Within Functional Limits for tasks assessed                                         General Comments General comments (skin integrity, edema, etc.): VSS on RA    Exercises     Assessment/Plan    PT Assessment Patient needs continued PT services  PT Problem List Decreased strength;Decreased balance;Decreased mobility;Impaired sensation       PT Treatment Interventions DME instruction;Stair training;Gait training;Functional mobility training;Balance training;Neuromuscular re-education;Patient/family education    PT Goals (Current goals can be found in the Care Plan section)  Acute Rehab PT Goals Patient Stated Goal: To return to independent mobility and regain L ankle function PT Goal Formulation: With patient Time For Goal Achievement: 01/10/20 Potential to Achieve Goals: Good Additional Goals Additional Goal #1: Pt will maintain dynamic balance within 10 inches of his base of support without UE support, independently.    Frequency Min 3X/week   Barriers to discharge        Co-evaluation               AM-PAC PT "6 Clicks" Mobility  Outcome Measure Help needed turning from your back to your side while in a flat bed without using bedrails?: None Help needed moving from lying on your back to sitting on the side of a flat bed without using bedrails?: None Help needed moving to and from a bed to a chair (including a wheelchair)?: None Help needed standing up from a chair using your arms (e.g., wheelchair or bedside chair)?: None Help needed to walk in hospital room?: None Help needed climbing 3-5 steps with a railing? : A Little 6 Click Score: 23    End of Session   Activity Tolerance: Patient tolerated treatment well Patient left: in chair;with call bell/phone within reach Nurse Communication: Mobility status PT Visit Diagnosis: Other abnormalities of gait and mobility (R26.89);Muscle weakness (generalized) (M62.81)    Time: 5465-6812 PT Time Calculation (min) (ACUTE ONLY): 25 min   Charges:   PT  Evaluation $PT Eval Moderate Complexity: 1 Mod PT Treatments $Gait Training: 8-22 mins        Arlyss Gandy, PT, DPT Acute Rehabilitation Pager: [phone]   Arlyss Gandy 12/27/2019, 3:41 PM

## 2019-12-27 NOTE — Progress Notes (Signed)
Pharmacy Antibiotic Note  Delton Stelle is a 39 y.o. male admitted on 12/25/2019 with Infectious myofascitis.  Pharmacy has been consulted for Vanco, Zosyn dosing.  Transfer from [place] c/o L knee pain with swelling and redness. Noted AST 1432, ALT 336, Tbili 1.2, LA 3, PC 1.78, Ddimer 3274 - CT at [facility]: Infectious myofascitis  Vancomycin trough this morning came back subtherapeutic at 12, on 1 g IV every 8 hours. Scr stable at 0.9, afebrile. WBC 10.7, CRP 15.8.   Plan: Increase vancomycin 1250 mg IV every 8 hours  Zosyn 3.375g IV q8hr Monitor cx results, clinical pic, and vanc levels as appropriate   Height: 6\' 2"  (188 cm) Weight: 76.2 kg (168 lb 1.6 oz) IBW/kg (Calculated) : 82.2  Temp (24hrs), Avg:99.2 F (37.3 C), Min:98.8 F (37.1 C), Max:100 F (37.8 C)  Recent Labs  Lab 12/25/19 2221 12/26/19 0305 12/27/19 0809  WBC  --  12.9* 10.7*  CREATININE  --  1.04 0.93  LATICACIDVEN 1.6  --   --   VANCOTROUGH  --   --  12*    Estimated Creatinine Clearance: 118.5 mL/min (by C-G formula based on SCr of 0.93 mg/dL).    Allergies  Allergen Reactions  . Naproxen Rash and Swelling    Other reaction(s): Joint Pain  . Carbamazepine Hives   Antimicrobials this admission:  Zosyn 6/16 >>  Vancomycin 6/17 >>   Dose adjustments this admission:  6/18- VT 12 (~8 hr after last dose): increase to 1250 mg IV every 8 hrs  Microbiology results:  6/16 BCx: ngtd 6/17 Sputum: rare GPC >> cx reincubated   7/17, PharmD, BCCCP Clinical Pharmacist  Phone: [phone] 12/27/2019 9:24 AM  Please check AMION for all [facility] Pharmacy phone numbers After 10:00 PM, call Main Pharmacy [phone]

## 2019-12-27 NOTE — Progress Notes (Signed)
PROGRESS NOTE    Daniel Trujillo  TKW:[id number] DOB: January 08, 1981 DOA: 12/25/2019 PCP: Carlus Pavlov, MD   No chief complaint on file.   Brief Narrative:  39 year old male prior history of IV drug abuse, opiate dependence on Suboxone presents to ED for evaluation of fever associated with pain and redness of the left lower extremity.  Patient was seen at [facility] center and transferred to [facility] for further evaluation and management of myositis.  CT of the left lower extremity demonstrate patchy hypodense appearance of the distal semimembranous and proximal gastrocnemius musculature with inflammatory stranding without any abscess or air.  CT angiogram of the chest shows groundglass opacities in the bilateral lungs consistent with atypical pneumonia. Covid test was negative.  Patient was started on broad-spectrum IV antibiotics. The swelling, tenderness and redness improving. PT evaluation ordered.    Assessment & Plan:   Principal Problem:   Infectious myositis Active Problems:   Sepsis ([employer])   Multifocal pneumonia   IV drug abuse ([place])   Opiate dependence ([place])   Elevated liver transaminase level   Positive D dimer   Hyponatremia  Sepsis secondary to myofascitis involving semimembranosus and proximal gastrocnemius muscles, present on admission. In the setting of IV drug abuse. Patient was febrile on admission with elevated lactic acid, tachycardic and leukocytosis.  Sepsis physiology improving.  Patient is currently afebrile, improving leukocytosis, lactic acid has normalized. Continue with broad-spectrum IV antibiotics at this time and trend inflammatory markers.  Follow blood cultures. Blood cultures have been negative so far. CRP is improving. No fevers this am. Leukocytosis is improving.       Acute hypoxic respiratory failure with elevated D-dimer and CT angiogram suspicious for atypical pneumonia. Sputum cultures are pending. Continue broad-spectrum IV  antibiotics. Follow blood cultures. Pt denies any chest pain or sob.     Elevated transaminases, no symptoms of nausea or vomiting or abdominal pain. 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 No nausea or vomiting or abdominal pain.    History of IV drug abuse with opiate dependence on Suboxone. Continue with Suboxone.   DVT prophylaxis: Lovenox.  Code Status: full code.  Family Communication: none at bedside.  Disposition:   Status is: Inpatient  Remains inpatient appropriate because:IV treatments appropriate due to intensity of illness or inability to take PO   Dispo: The patient is from: Home              Anticipated d/c is to: Home              Anticipated d/c date is: 2 days              Patient currently is not medically stable to d/c.       Consultants:   None.    Procedures: none.   Antimicrobials:  Anti-infectives (From admission, onward)   Start     Dose/Rate Route Frequency Ordered Stop   12/27/19 1000  vancomycin (VANCOREADY) IVPB 1250 mg/250 mL     Discontinue     1,250 mg 166.7 mL/hr over 90 Minutes Intravenous Every 8 hours 12/27/19 0923     12/26/19 0000  vancomycin (VANCOCIN) IVPB 1000 mg/200 mL premix  Status:  Discontinued        1,000 mg 200 mL/hr over 60 Minutes Intravenous Every 8 hours 12/25/19 2201 12/27/19 0923   12/25/19 2300  piperacillin-tazobactam (ZOSYN) IVPB 3.375 g     Discontinue     3.375 g 12.5 mL/hr over 240  Minutes Intravenous Every 8 hours 12/25/19 2201           Subjective: Tenderness improving, pt able to get out of bed, no sob or chest pain , nausea or vomiting.   Objective: Vitals:   12/27/19 0019 12/27/19 0422 12/27/19 0851 12/27/19 1115  BP: 127/82 130/87 128/80 140/81  Pulse: 64 72 70 (!) 59  Resp: 18 18 20 20   Temp: 98.8 F (37.1 C) 98.9 F (37.2 C) 99.1 F (37.3 C) 98.3 F (36.8 C)  TempSrc: Oral Oral Oral Oral  SpO2: 97% 98% 98% 100%  Weight: 76.2 kg     Height:         Intake/Output Summary (Last 24 hours) at 12/27/2019 1549 Last data filed at 12/27/2019 1100 Gross per 24 hour  Intake 1238.76 ml  Output 1301 ml  Net -62.24 ml   Filed Weights   12/25/19 2045 12/26/19 0330 12/27/19 0019  Weight: 77.7 kg 76.8 kg 76.2 kg    Examination:  General exam: Alert and comfortable, not in any distress Respiratory system: Clear to auscultation bilaterally, no wheezing or rhonchi Cardiovascular system: S1 S2 heard, no JVD, regular rate rhythm, no pedal edema Gastrointestinal system: Abdomen is soft, nontender nondistended, bowel sounds normal Central nervous system: Alert and oriented, grossly nonfocal Extremities: Improving erythema, tenderness of the lateral aspect of the thigh and back of the knee on the left. Skin:  See above,  Psychiatry: Mood is appropriate    Data Reviewed: I have personally reviewed following labs and imaging studies  CBC: Recent Labs  Lab 12/26/19 0305 12/27/19 0809  WBC 12.9* 10.7*  NEUTROABS 9.9* 8.3*  HGB 12.0* 12.1*  HCT 35.6* 36.5*  MCV 90.6 91.7  PLT 179 218    Basic Metabolic Panel: Recent Labs  Lab 12/26/19 0305 12/27/19 0809  NA 136 138  K 3.6 3.3*  CL 101 105  CO2 28 24  GLUCOSE 132* 152*  BUN 9 12  CREATININE 1.04 0.93  CALCIUM 8.2* 8.4*    GFR: Estimated Creatinine Clearance: 118.5 mL/min (by C-G formula based on SCr of 0.93 mg/dL).  Liver Function Tests: Recent Labs  Lab 12/26/19 0305 12/27/19 0809  AST 687* 363*  ALT 254* 222*  ALKPHOS 58 56  BILITOT 0.9 0.9  PROT 6.1* 6.4*  ALBUMIN 2.8* 2.7*    CBG: No results for input(s): GLUCAP in the last 168 hours.   Recent Results (from the past 240 hour(s))  Culture, blood (routine x 2)     Status: None (Preliminary result)   Collection Time: 12/25/19 10:26 PM   Specimen: BLOOD LEFT HAND  Result Value Ref Range Status   Specimen Description BLOOD LEFT HAND  Final   Special Requests   Final    BOTTLES DRAWN AEROBIC AND ANAEROBIC  Blood Culture adequate volume   Culture   Final    NO GROWTH 1 DAY Performed at [facility], [address], [place], [place]    Report Status PENDING  Incomplete  Culture, blood (routine x 2)     Status: None (Preliminary result)   Collection Time: 12/25/19 10:33 PM   Specimen: BLOOD  Result Value Ref Range Status   Specimen Description BLOOD RIGHT ANTECUBITAL  Final   Special Requests   Final    BOTTLES DRAWN AEROBIC AND ANAEROBIC Blood Culture adequate volume   Culture   Final    NO GROWTH 1 DAY Performed at [facility], [address], [place], [place]  [address]    Report Status PENDING  Incomplete  SARS Coronavirus 2 by RT PCR (hospital order, performed in [facility] hospital lab)     Status: None   Collection Time: 12/25/19 11:43 PM  Result Value Ref Range Status   SARS Coronavirus 2 NEGATIVE NEGATIVE Final    Comment: (NOTE) SARS-CoV-2 target nucleic acids are NOT DETECTED.  The SARS-CoV-2 RNA is generally detectable in upper and lower respiratory specimens during the acute phase of infection. The lowest concentration of SARS-CoV-2 viral copies this assay can detect is 250 copies / mL. A negative result does not preclude SARS-CoV-2 infection and should not be used as the sole basis for treatment or other patient management decisions.  A negative result may occur with improper specimen collection / handling, submission of specimen other than nasopharyngeal swab, presence of viral mutation(s) within the areas targeted by this assay, and inadequate number of viral copies (<250 copies / mL). A negative result must be combined with clinical observations, patient history, and epidemiological information.  Fact Sheet for Patients:   BoilerBrush.com.cy  Fact Sheet for Healthcare Providers: https://pope.com/  This test is not yet approved or  cleared by the [place] FDA and has been authorized for  detection and/or diagnosis of SARS-CoV-2 by FDA under an Emergency Use Authorization (EUA).  This EUA will remain in effect (meaning this test can be used) for the duration of the COVID-19 declaration under Section 564(b)(1) of the Act, 21 U.S.C. section 360bbb-3(b)(1), unless the authorization is terminated or revoked sooner.  Performed at [facility], [address] [place], [place] [address]   Culture, sputum-assessment     Status: None   Collection Time: 12/26/19  9:32 AM   Specimen: Expectorated Sputum  Result Value Ref Range Status   Specimen Description EXPECTORATED SPUTUM  Final   Special Requests NONE  Final   Sputum evaluation   Final    THIS SPECIMEN IS ACCEPTABLE FOR SPUTUM CULTURE Performed at [facility], [address], [place], [place] [address]    Report Status 12/26/2019 FINAL  Final  Culture, respiratory     Status: None (Preliminary result)   Collection Time: 12/26/19  9:32 AM  Result Value Ref Range Status   Specimen Description EXPECTORATED SPUTUM  Final   Special Requests NONE Reflexed from [id number]  Final   Gram Stain   Final    RARE WBC PRESENT, PREDOMINANTLY PMN RARE GRAM POSITIVE COCCI    Culture   Final    CULTURE REINCUBATED FOR BETTER GROWTH Performed at [facility], [address] [place], [place] [address]    Report Status PENDING  Incomplete         Radiology Studies: CT ANGIO CHEST PE W OR WO CONTRAST  Result Date: 12/26/2019 CLINICAL DATA:  Respiratory failure and IV drug abuse. EXAM: CT ANGIOGRAPHY CHEST WITH CONTRAST TECHNIQUE: Multidetector CT imaging of the chest was performed using the standard protocol during bolus administration of intravenous contrast. Multiplanar CT image reconstructions and MIPs were obtained to evaluate the vascular anatomy. CONTRAST:  3mL OMNIPAQUE IOHEXOL 350 MG/ML SOLN COMPARISON:  Chest CTA report 11/02/2013 FINDINGS: Cardiovascular: Normal heart size. No pericardial effusion. No  pulmonary artery filling defect when allowing for areas of mild motion artifact. Normal aorta. Mediastinum/Nodes: No adenopathy or mass. Lungs/Pleura: Ground-glass opacity in the perihilar and subpleural lungs. History of IV drug abuse with no cavitary or nodular components. No pulmonary edema or significant pleural effusion Upper Abdomen: Negative  Musculoskeletal: No acute or aggressive finding Review of the MIP images confirms the above findings. IMPRESSION: 1. Negative for pulmonary embolism. 2. Patchy ground-glass opacity in the bilateral lung as seen with atypical pneumonia and ARDS. No nodular or cavitating airspace disease to correlate with IV drug abuse history. Electronically Signed   By: Marnee Spring M.D.   On: 12/26/2019 05:27   US Abdomen Limited  Result Date: 12/26/2019 CLINICAL DATA:  Elevated liver enzyme EXAM: ULTRASOUND ABDOMEN LIMITED RIGHT UPPER QUADRANT COMPARISON:  None. FINDINGS: Gallbladder: Slightly dilated but no shadowing stones. Negative sonographic Murphy. Normal wall thickness. Common bile duct: Diameter: 5 mm Liver: Liver is echogenic. No focal hepatic abnormality. Portal vein is patent on color Doppler imaging with normal direction of blood flow towards the liver. Other: None. IMPRESSION: 1. Echogenic liver suggesting steatosis. 2. Negative for gallstones or biliary dilatation Electronically Signed   By: Jasmine Pang M.D.   On: 12/26/2019 20:57        Scheduled Meds: . buprenorphine-naloxone  1 tablet Sublingual BID  . busPIRone  20 mg Oral BID  . enoxaparin (LOVENOX) injection  40 mg Subcutaneous Q24H  . FLUoxetine  40 mg Oral q morning - 10a  . mirtazapine  30 mg Oral QHS  . nicotine  14 mg Transdermal Daily  . sodium chloride flush  3 mL Intravenous Q12H   Continuous Infusions: . piperacillin-tazobactam (ZOSYN)  IV 3.375 g (12/27/19 1529)  . vancomycin 166.7 mL/hr at 12/27/19 1045     LOS: 2 days        Kathlen Mody, MD [facility]   To  contact the attending provider between 7A-7P or the covering provider during after hours 7P-7A, please log into the web site [email or web address] and access using universal [place] password for that web site. If you do not have the password, please call the hospital operator.  12/27/2019, 3:49 PM

## 2019-12-28 LAB — COMPREHENSIVE METABOLIC PANEL
ALT: 209 U/L — ABNORMAL HIGH (ref 0–44)
AST: 281 U/L — ABNORMAL HIGH (ref 15–41)
Albumin: 2.8 g/dL — ABNORMAL LOW (ref 3.5–5.0)
Alkaline Phosphatase: 66 U/L (ref 38–126)
Anion gap: 9 (ref 5–15)
BUN: 11 mg/dL (ref 6–20)
CO2: 23 mmol/L (ref 22–32)
Calcium: 8.4 mg/dL — ABNORMAL LOW (ref 8.9–10.3)
Chloride: 107 mmol/L (ref 98–111)
Creatinine, Ser: 0.97 mg/dL (ref 0.61–1.24)
GFR calc Af Amer: >60 mL/min (ref >60)
GFR calc non Af Amer: >60 mL/min (ref >60)
Glucose, Bld: 96 mg/dL (ref 70–99)
Potassium: 4.2 mmol/L (ref 3.5–5.1)
Sodium: 139 mmol/L (ref 135–145)
Total Bilirubin: 1.2 mg/dL (ref 0.3–1.2)
Total Protein: 6.6 g/dL (ref 6.5–8.1)

## 2019-12-28 LAB — CBC WITH DIFFERENTIAL/PLATELET
Abs Immature Granulocytes: 0.07 10*3/uL (ref 0.00–0.07)
Basophils Absolute: 0 10*3/uL (ref 0.0–0.1)
Basophils Relative: 0 %
Eosinophils Absolute: 0.2 10*3/uL (ref 0.0–0.5)
Eosinophils Relative: 3 %
HCT: 40 % (ref 39.0–52.0)
Hemoglobin: 12.9 g/dL — ABNORMAL LOW (ref 13.0–17.0)
Immature Granulocytes: 1 %
Lymphocytes Relative: 17 %
Lymphs Abs: 1.6 10*3/uL (ref 0.7–4.0)
MCH: 29.9 pg (ref 26.0–34.0)
MCHC: 32.3 g/dL (ref 30.0–36.0)
MCV: 92.8 fL (ref 80.0–100.0)
Monocytes Absolute: 0.9 10*3/uL (ref 0.1–1.0)
Monocytes Relative: 9 %
Neutro Abs: 6.8 10*3/uL (ref 1.7–7.7)
Neutrophils Relative %: 70 %
Platelets: 247 10*3/uL (ref 150–400)
RBC: 4.31 MIL/uL (ref 4.22–5.81)
RDW: 13 % (ref 11.5–15.5)
WBC: 9.7 10*3/uL (ref 4.0–10.5)
nRBC: 0 % (ref 0.0–0.2)

## 2019-12-28 LAB — CULTURE, RESPIRATORY W GRAM STAIN: Culture: NORMAL

## 2019-12-28 LAB — C-REACTIVE PROTEIN: CRP: 8.5 mg/dL — ABNORMAL HIGH (ref <1.0)

## 2019-12-28 LAB — SEDIMENTATION RATE: Sed Rate: 70 mm/hr — ABNORMAL HIGH (ref 0–16)

## 2019-12-28 MED ORDER — ALUM & MAG HYDROXIDE-SIMETH 200-200-20 MG/5ML PO SUSP
30.0000 mL | Freq: Four times a day (QID) | ORAL | Status: DC | PRN
  Administered 2019-12-28 – 2019-12-30 (×2): 30 mL via ORAL
  Filled 2019-12-28 (×2): qty 30

## 2019-12-28 MED ORDER — SODIUM CHLORIDE 0.9 % IV SOLN
INTRAVENOUS | Status: DC | PRN
  Administered 2019-12-28 – 2019-12-29 (×3): 250 mL via INTRAVENOUS

## 2019-12-28 MED ORDER — PANTOPRAZOLE SODIUM 40 MG PO TBEC
40.0000 mg | DELAYED_RELEASE_TABLET | Freq: Every day | ORAL | Status: DC
  Administered 2019-12-28 – 2019-12-30 (×3): 40 mg via ORAL
  Filled 2019-12-28 (×3): qty 1

## 2019-12-28 NOTE — Progress Notes (Signed)
PROGRESS NOTE    Daniel Trujillo  DVV:[id number] DOB: May 11, 1981 DOA: 12/25/2019 PCP: Desmond Dike, MD   No chief complaint on file.   Brief Narrative:  39 year old male prior history of IV drug abuse, opiate dependence on Suboxone presents to ED for evaluation of fever associated with pain and redness of the left lower extremity.  Patient was seen at [facility] center and transferred to [facility] for further evaluation and management of myositis.  CT of the left lower extremity demonstrate patchy hypodense appearance of the distal semimembranous and proximal gastrocnemius musculature with inflammatory stranding without any abscess or air.  CT angiogram of the chest shows groundglass opacities in the bilateral lungs consistent with atypical pneumonia. Covid test was negative.  Patient was started on broad-spectrum IV antibiotics. Pt seen and examined at bedside, his cellulitis/ myositis improving. Recommend to complete at least 5 days of IV antibiotics prior to discharge.    Assessment & Plan:   Principal Problem:   Infectious myositis Active Problems:   Sepsis (HCC)   Multifocal pneumonia   IV drug abuse (HCC)   Opiate dependence (HCC)   Elevated liver transaminase level   Positive D dimer   Hyponatremia  Sepsis secondary to myofascitis involving semimembranosus and proximal gastrocnemius muscles, present on admission. In the setting of IV drug abuse. Patient was febrile on admission with elevated lactic acid, tachycardic and leukocytosis.  Sepsis physiology improving.  Patient is currently afebrile, improving leukocytosis, lactic acid has normalized. Continue with broad-spectrum IV antibiotics at this time and trend inflammatory markers.  Follow blood cultures. Blood cultures have been negative so far. CRP is improving. Afebrile , wbc wnl .   Recommend to complete atleast 5 days of broad spectrum IV antibiotics, before transitioning to oral antibiotics.  Will request ID  input for duration of the antibiotics.     Acute hypoxic respiratory failure with elevated D-dimer and CT angiogram suspicious for atypical pneumonia. Pt denies any cough or sob.  Continue broad-spectrum IV antibiotics. Follow blood cultures. Pt denies any chest pain or sob.     Elevated transaminases, no symptoms of nausea or vomiting or abdominal pain. 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 [place] abd shows liver steatosis, pt denies any nausea, vomiting or abdominal pain.  Improving liver enzymes.    History of IV drug abuse with opiate dependence on Suboxone. Continue with Suboxone.  Hypokalemia:  Replaced.    DVT prophylaxis: Lovenox.  Code Status: full code.  Family Communication: none at bedside.  Disposition:   Status is: Inpatient  Remains inpatient appropriate because:IV treatments appropriate due to intensity of illness or inability to take PO   Dispo: The patient is from: Home              Anticipated d/c is to: Home              Anticipated d/c date is: 2 days              Patient currently is not medically stable to d/c.       Consultants:   None.    Procedures: none.   Antimicrobials:  Anti-infectives (From admission, onward)   Start     Dose/Rate Route Frequency Ordered Stop   12/27/19 1000  vancomycin (VANCOREADY) IVPB 1250 mg/250 mL     Discontinue     1,250 mg 166.7 mL/hr over 90 Minutes Intravenous Every 8 hours 12/27/19 0923     12/26/19 0000  vancomycin (VANCOCIN)  IVPB 1000 mg/200 mL premix  Status:  Discontinued        1,000 mg 200 mL/hr over 60 Minutes Intravenous Every 8 hours 12/25/19 2201 12/27/19 0923   12/25/19 2300  piperacillin-tazobactam (ZOSYN) IVPB 3.375 g     Discontinue     3.375 g 12.5 mL/hr over 240 Minutes Intravenous Every 8 hours 12/25/19 2201           Subjective: Tenderness, erythema, imrpving, no chest pain or sob, nausea, vomiting or abdominal .   Objective: Vitals:   12/27/19 1926  12/28/19 0246 12/28/19 1000 12/28/19 1121  BP: 128/85 124/89 127/87 130/74  Pulse: 67 85 62 65  Resp: 18 20 19 20   Temp: 99.2 F (37.3 C) 98.4 F (36.9 C) 98.7 F (37.1 C) 98.5 F (36.9 C)  TempSrc: Oral Oral Oral Oral  SpO2: 100% 98% 100% 100%  Weight:  75.6 kg    Height:        Intake/Output Summary (Last 24 hours) at 12/28/2019 1609 Last data filed at 12/28/2019 0948 Gross per 24 hour  Intake 1182 ml  Output 700 ml  Net 482 ml   Filed Weights   12/26/19 0330 12/27/19 0019 12/28/19 0246  Weight: 76.8 kg 76.2 kg 75.6 kg    Examination:  General exam: alert and comfortable.  Respiratory system: clear to auscultation, no wheezing or rhonchi.  Cardiovascular system: S1S2, RRR, no JVD, no pedal edema.  Gastrointestinal system: abdomen is soft , non tender non distended, bowel sounds okay.  Central nervous system: Alert and oriented, grossly nonfocal Extremities: improving erythema, and tenderness. No pedal edema.  Skin:  See above,  Psychiatry: mood is appropriate.    Data Reviewed: I have personally reviewed following labs and imaging studies  CBC: Recent Labs  Lab 12/26/19 0305 12/27/19 0809 12/28/19 0501  WBC 12.9* 10.7* 9.7  NEUTROABS 9.9* 8.3* 6.8  HGB 12.0* 12.1* 12.9*  HCT 35.6* 36.5* 40.0  MCV 90.6 91.7 92.8  PLT 179 218 247    Basic Metabolic Panel: Recent Labs  Lab 12/26/19 0305 12/27/19 0809 12/28/19 0501  NA 136 138 139  K 3.6 3.3* 4.2  CL 101 105 107  CO2 28 24 23   GLUCOSE 132* 152* 96  BUN 9 12 11   CREATININE 1.04 0.93 0.97  CALCIUM 8.2* 8.4* 8.4*    GFR: Estimated Creatinine Clearance: 112.6 mL/min (by C-G formula based on SCr of 0.97 mg/dL).  Liver Function Tests: Recent Labs  Lab 12/26/19 0305 12/27/19 0809 12/28/19 0501  AST 687* 363* 281*  ALT 254* 222* 209*  ALKPHOS 58 56 66  BILITOT 0.9 0.9 1.2  PROT 6.1* 6.4* 6.6  ALBUMIN 2.8* 2.7* 2.8*    CBG: No results for input(s): GLUCAP in the last 168 hours.   Recent  Results (from the past 240 hour(s))  Culture, blood (routine x 2)     Status: None (Preliminary result)   Collection Time: 12/25/19 10:26 PM   Specimen: BLOOD LEFT HAND  Result Value Ref Range Status   Specimen Description BLOOD LEFT HAND  Final   Special Requests   Final    BOTTLES DRAWN AEROBIC AND ANAEROBIC Blood Culture adequate volume   Culture   Final    NO GROWTH 2 DAYS Performed at [facility], [address] [place], [facility] [address]    Report Status PENDING  Incomplete  Culture, blood (routine x 2)     Status: None (Preliminary result)   Collection Time: 12/25/19 10:33  PM   Specimen: BLOOD  Result Value Ref Range Status   Specimen Description BLOOD RIGHT ANTECUBITAL  Final   Special Requests   Final    BOTTLES DRAWN AEROBIC AND ANAEROBIC Blood Culture adequate volume   Culture   Final    NO GROWTH 2 DAYS Performed at [facility], [address], [place], [place] [address]    Report Status PENDING  Incomplete  SARS Coronavirus 2 by RT PCR (hospital order, performed in [facility] hospital lab)     Status: None   Collection Time: 12/25/19 11:43 PM  Result Value Ref Range Status   SARS Coronavirus 2 NEGATIVE NEGATIVE Final    Comment: (NOTE) SARS-CoV-2 target nucleic acids are NOT DETECTED.  The SARS-CoV-2 RNA is generally detectable in upper and lower respiratory specimens during the acute phase of infection. The lowest concentration of SARS-CoV-2 viral copies this assay can detect is 250 copies / mL. A negative result does not preclude SARS-CoV-2 infection and should not be used as the sole basis for treatment or other patient management decisions.  A negative result may occur with improper specimen collection / handling, submission of specimen other than nasopharyngeal swab, presence of viral mutation(s) within the areas targeted by this assay, and inadequate number of viral copies (<250 copies / mL). A negative result must be combined with  clinical observations, patient history, and epidemiological information.  Fact Sheet for Patients:   BoilerBrush.com.cy  Fact Sheet for Healthcare Providers: https://pope.com/  This test is not yet approved or  cleared by the [place] FDA and has been authorized for detection and/or diagnosis of SARS-CoV-2 by FDA under an Emergency Use Authorization (EUA).  This EUA will remain in effect (meaning this test can be used) for the duration of the COVID-19 declaration under Section 564(b)(1) of the Act, 21 U.S.C. section 360bbb-3(b)(1), unless the authorization is terminated or revoked sooner.  Performed at [facility], [address] [place], [place] [address]   Culture, sputum-assessment     Status: None   Collection Time: 12/26/19  9:32 AM   Specimen: Expectorated Sputum  Result Value Ref Range Status   Specimen Description EXPECTORATED SPUTUM  Final   Special Requests NONE  Final   Sputum evaluation   Final    THIS SPECIMEN IS ACCEPTABLE FOR SPUTUM CULTURE Performed at [facility], [address], [place], [place] [address]    Report Status 12/26/2019 FINAL  Final  Culture, respiratory     Status: None   Collection Time: 12/26/19  9:32 AM  Result Value Ref Range Status   Specimen Description EXPECTORATED SPUTUM  Final   Special Requests NONE Reflexed from [id number]  Final   Gram Stain   Final    RARE WBC PRESENT, PREDOMINANTLY PMN RARE GRAM POSITIVE COCCI    Culture   Final    Consistent with normal respiratory flora. Performed at [facility], [address], [place], [place] [address]    Report Status 12/28/2019 FINAL  Final  Culture, blood (Routine X 2) w Reflex to ID Panel     Status: None (Preliminary result)   Collection Time: 12/26/19 12:10 PM   Specimen: BLOOD LEFT HAND  Result Value Ref Range Status   Specimen Description BLOOD LEFT HAND  Final   Special Requests   Final    BOTTLES DRAWN  AEROBIC ONLY Blood Culture adequate volume   Culture   Final    NO GROWTH 2 DAYS  Performed at [facility], [place] [address] [place], [place] [address]    Report Status PENDING  Incomplete  Culture, blood (Routine X 2) w Reflex to ID Panel     Status: None (Preliminary result)   Collection Time: 12/26/19 12:15 PM   Specimen: BLOOD RIGHT HAND  Result Value Ref Range Status   Specimen Description BLOOD RIGHT HAND  Final   Special Requests   Final    BOTTLES DRAWN AEROBIC ONLY Blood Culture results may not be optimal due to an inadequate volume of blood received in culture bottles   Culture   Final    NO GROWTH 2 DAYS Performed at [facility], [place] [address] [place], [place] [address]    Report Status PENDING  Incomplete         Radiology Studies: US Abdomen Limited  Result Date: 12/26/2019 CLINICAL DATA:  Elevated liver enzyme EXAM: ULTRASOUND ABDOMEN LIMITED RIGHT UPPER QUADRANT COMPARISON:  None. FINDINGS: Gallbladder: Slightly dilated but no shadowing stones. Negative sonographic Murphy. Normal wall thickness. Common bile duct: Diameter: 5 mm Liver: Liver is echogenic. No focal hepatic abnormality. Portal vein is patent on color Doppler imaging with normal direction of blood flow towards the liver. Other: None. IMPRESSION: 1. Echogenic liver suggesting steatosis. 2. Negative for gallstones or biliary dilatation Electronically Signed   By: Donavan Foil M.D.   On: 12/26/2019 20:57        Scheduled Meds: . buprenorphine-naloxone  1 tablet Sublingual BID  . busPIRone  20 mg Oral BID  . enoxaparin (LOVENOX) injection  40 mg Subcutaneous Q24H  . FLUoxetine  40 mg Oral q morning - 10a  . mirtazapine  30 mg Oral QHS  . nicotine  14 mg Transdermal Daily  . pantoprazole  40 mg Oral Q0600  . sodium chloride flush  3 mL Intravenous Q12H   Continuous Infusions: . sodium chloride 250 mL (12/28/19 0524)  . piperacillin-tazobactam (ZOSYN)  IV 3.375 g (12/28/19 1540)  .  vancomycin 1,250 mg (12/28/19 0941)     LOS: 3 days        Hosie Poisson, MD [facility]   To contact the attending provider between 7A-7P or the covering provider during after hours 7P-7A, please log into the web site [email or web address] and access using universal [facility] password for that web site. If you do not have the password, please call the hospital operator.  12/28/2019, 4:09 PM

## 2019-12-29 LAB — CBC WITH DIFFERENTIAL/PLATELET
Abs Immature Granulocytes: 0.09 10*3/uL — ABNORMAL HIGH (ref 0.00–0.07)
Basophils Absolute: 0.1 10*3/uL (ref 0.0–0.1)
Basophils Relative: 1 %
Eosinophils Absolute: 0.3 10*3/uL (ref 0.0–0.5)
Eosinophils Relative: 3 %
HCT: 35.3 % — ABNORMAL LOW (ref 39.0–52.0)
Hemoglobin: 11.8 g/dL — ABNORMAL LOW (ref 13.0–17.0)
Immature Granulocytes: 1 %
Lymphocytes Relative: 16 %
Lymphs Abs: 1.2 10*3/uL (ref 0.7–4.0)
MCH: 30.3 pg (ref 26.0–34.0)
MCHC: 33.4 g/dL (ref 30.0–36.0)
MCV: 90.5 fL (ref 80.0–100.0)
Monocytes Absolute: 0.9 10*3/uL (ref 0.1–1.0)
Monocytes Relative: 11 %
Neutro Abs: 5.5 10*3/uL (ref 1.7–7.7)
Neutrophils Relative %: 68 %
Platelets: 235 10*3/uL (ref 150–400)
RBC: 3.9 MIL/uL — ABNORMAL LOW (ref 4.22–5.81)
RDW: 12.7 % (ref 11.5–15.5)
WBC: 8 10*3/uL (ref 4.0–10.5)
nRBC: 0 % (ref 0.0–0.2)

## 2019-12-29 LAB — COMPREHENSIVE METABOLIC PANEL
ALT: 172 U/L — ABNORMAL HIGH (ref 0–44)
AST: 179 U/L — ABNORMAL HIGH (ref 15–41)
Albumin: 2.6 g/dL — ABNORMAL LOW (ref 3.5–5.0)
Alkaline Phosphatase: 59 U/L (ref 38–126)
Anion gap: 8 (ref 5–15)
BUN: 10 mg/dL (ref 6–20)
CO2: 24 mmol/L (ref 22–32)
Calcium: 8.4 mg/dL — ABNORMAL LOW (ref 8.9–10.3)
Chloride: 106 mmol/L (ref 98–111)
Creatinine, Ser: 0.92 mg/dL (ref 0.61–1.24)
GFR calc Af Amer: >60 mL/min (ref >60)
GFR calc non Af Amer: >60 mL/min (ref >60)
Glucose, Bld: 101 mg/dL — ABNORMAL HIGH (ref 70–99)
Potassium: 3.2 mmol/L — ABNORMAL LOW (ref 3.5–5.1)
Sodium: 138 mmol/L (ref 135–145)
Total Bilirubin: 0.6 mg/dL (ref 0.3–1.2)
Total Protein: 6 g/dL — ABNORMAL LOW (ref 6.5–8.1)

## 2019-12-29 LAB — SEDIMENTATION RATE: Sed Rate: 40 mm/hr — ABNORMAL HIGH (ref 0–16)

## 2019-12-29 LAB — C-REACTIVE PROTEIN: CRP: 3.5 mg/dL — ABNORMAL HIGH (ref <1.0)

## 2019-12-29 LAB — VANCOMYCIN, TROUGH: Vancomycin Tr: 16 ug/mL (ref 15–20)

## 2019-12-29 MED ORDER — POTASSIUM CHLORIDE CRYS ER 20 MEQ PO TBCR
40.0000 meq | EXTENDED_RELEASE_TABLET | Freq: Once | ORAL | Status: AC
  Administered 2019-12-29: 40 meq via ORAL
  Filled 2019-12-29: qty 2

## 2019-12-29 NOTE — Progress Notes (Signed)
PROGRESS NOTE    Daniel Trujillo  FTD:[id number] DOB: 1981/04/30 DOA: 12/25/2019 PCP: Desmond Dike, MD   No chief complaint on file.   Brief Narrative:  39 year old male prior history of IV drug abuse, opiate dependence on Suboxone presents to ED for evaluation of fever associated with pain and redness of the left lower extremity.  Patient was seen at [facility] center and transferred to [facility] for further evaluation and management of myositis.  CT of the left lower extremity demonstrate patchy hypodense appearance of the distal semimembranous and proximal gastrocnemius musculature with inflammatory stranding without any abscess or air.  CT angiogram of the chest shows groundglass opacities in the bilateral lungs consistent with atypical pneumonia. Covid test was negative.  Patient was started on broad-spectrum IV antibiotics.Recommend to complete at least 5 days of IV antibiotics prior to discharge.  Will plan for discharge for tomorrow.  Pt seen and examined , reports the pain is improving.    Assessment & Plan:   Principal Problem:   Infectious myositis Active Problems:   Sepsis (HCC)   Multifocal pneumonia   IV drug abuse (HCC)   Opiate dependence (HCC)   Elevated liver transaminase level   Positive D dimer   Hyponatremia  Sepsis secondary to myofascitis involving semimembranosus and proximal gastrocnemius muscles, present on admission. In the setting of IV drug abuse. Patient was febrile on admission with elevated lactic acid, tachycardic and leukocytosis.  Sepsis physiology improving.  Patient is currently afebrile, improving leukocytosis, lactic acid has normalized. Continue with broad-spectrum IV antibiotics at this time and trend inflammatory markers.   Blood cultures have been negative so far. CRP is improving. Afebrile , wbc wnl .   Recommend to complete atleast 5 days of broad spectrum IV antibiotics, before transitioning to oral antibiotics.  Will request  ID input for duration of the antibiotics.  No changes in antibiotics today.  Pt reports his leg pain, erythema is improving.     Acute hypoxic respiratory failure with elevated D-dimer and CT angiogram suspicious for atypical pneumonia. Pt denies any cough or sob.  Continue broad-spectrum IV antibiotics, no cough or palpitations.  Pt denies any chest pain or sob.    Elevated transaminases, no symptoms of nausea or vomiting or abdominal pain. 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 [place] abd shows liver steatosis, pt denies any nausea, vomiting or abdominal pain.  Improving liver enzymes. Recommend outpatient follow up with liver enzymes in 4 weeks.    History of IV drug abuse with opiate dependence on Suboxone. Continue with Suboxone.  Hypokalemia:  Replaced. Repeat potassium in the morning.   Mild normocytic anemia. Hemoglobin stable between 11-12.   DVT prophylaxis: Lovenox.  Code Status: full code.  Family Communication: none at bedside.  Disposition:   Status is: Inpatient  Remains inpatient appropriate because:IV treatments appropriate due to intensity of illness or inability to take PO   Dispo: The patient is from: Home              Anticipated d/c is to: Home              Anticipated d/c date is: 1 day              Patient currently is not medically stable to d/c.       Consultants:   None.    Procedures: none.   Antimicrobials:  Anti-infectives (From admission, onward)   Start     Dose/Rate Route Frequency  Ordered Stop   12/27/19 1000  vancomycin (VANCOREADY) IVPB 1250 mg/250 mL     Discontinue     1,250 mg 166.7 mL/hr over 90 Minutes Intravenous Every 8 hours 12/27/19 0923     12/26/19 0000  vancomycin (VANCOCIN) IVPB 1000 mg/200 mL premix  Status:  Discontinued        1,000 mg 200 mL/hr over 60 Minutes Intravenous Every 8 hours 12/25/19 2201 12/27/19 0923   12/25/19 2300  piperacillin-tazobactam (ZOSYN) IVPB 3.375 g      Discontinue     3.375 g 12.5 mL/hr over 240 Minutes Intravenous Every 8 hours 12/25/19 2201           Subjective: Leg pain, redness improved but not resolved. Pt still using walker to ambulate.   Objective: Vitals:   12/28/19 1922 12/29/19 0148 12/29/19 0258 12/29/19 0827  BP: 132/89  115/69 131/84  Pulse: 67  63 79  Resp: 16  16   Temp: 98.7 F (37.1 C)  98.3 F (36.8 C)   TempSrc: Oral  Oral   SpO2: 100%  98%   Weight:  75.3 kg    Height:        Intake/Output Summary (Last 24 hours) at 12/29/2019 1359 Last data filed at 12/29/2019 1140 Gross per 24 hour  Intake 840 ml  Output 2350 ml  Net -1510 ml   Filed Weights   12/27/19 0019 12/28/19 0246 12/29/19 0148  Weight: 76.2 kg 75.6 kg 75.3 kg    Examination:  General exam: Alert and comfortable, no distress noted Respiratory system: Clear to auscultation bilaterally, no wheezing or rhonchi Cardiovascular system: S1-S2 heard, regular rate rhythm, no JVD, no pedal edema.  Gastrointestinal system: Abdomen is soft, nontender, nondistended, bowel sounds are normal Central nervous system: Oriented and grossly nonfocal Extremities: imProving erythema and tenderness of the left thigh Skin:  See above,  Psychiatry: Mood is appropriate    Data Reviewed: I have personally reviewed following labs and imaging studies  CBC: Recent Labs  Lab 12/26/19 0305 12/27/19 0809 12/28/19 0501 12/29/19 0550  WBC 12.9* 10.7* 9.7 8.0  NEUTROABS 9.9* 8.3* 6.8 5.5  HGB 12.0* 12.1* 12.9* 11.8*  HCT 35.6* 36.5* 40.0 35.3*  MCV 90.6 91.7 92.8 90.5  PLT 179 218 247 235    Basic Metabolic Panel: Recent Labs  Lab 12/26/19 0305 12/27/19 0809 12/28/19 0501 12/29/19 0550  NA 136 138 139 138  K 3.6 3.3* 4.2 3.2*  CL 101 105 107 106  CO2 28 24 23 24   GLUCOSE 132* 152* 96 101*  BUN 9 12 11 10   CREATININE 1.04 0.93 0.97 0.92  CALCIUM 8.2* 8.4* 8.4* 8.4*    GFR: Estimated Creatinine Clearance: 118.2 mL/min (by C-G formula based  on SCr of 0.92 mg/dL).  Liver Function Tests: Recent Labs  Lab 12/26/19 0305 12/27/19 0809 12/28/19 0501 12/29/19 0550  AST 687* 363* 281* 179*  ALT 254* 222* 209* 172*  ALKPHOS 58 56 66 59  BILITOT 0.9 0.9 1.2 0.6  PROT 6.1* 6.4* 6.6 6.0*  ALBUMIN 2.8* 2.7* 2.8* 2.6*    CBG: No results for input(s): GLUCAP in the last 168 hours.   Recent Results (from the past 240 hour(s))  Culture, blood (routine x 2)     Status: None (Preliminary result)   Collection Time: 12/25/19 10:26 PM   Specimen: BLOOD LEFT HAND  Result Value Ref Range Status   Specimen Description BLOOD LEFT HAND  Final   Special Requests   Final  BOTTLES DRAWN AEROBIC AND ANAEROBIC Blood Culture adequate volume   Culture   Final    NO GROWTH 3 DAYS Performed at [facility], [place] [address] [place], [place] [address]    Report Status PENDING  Incomplete  Culture, blood (routine x 2)     Status: None (Preliminary result)   Collection Time: 12/25/19 10:33 PM   Specimen: BLOOD  Result Value Ref Range Status   Specimen Description BLOOD RIGHT ANTECUBITAL  Final   Special Requests   Final    BOTTLES DRAWN AEROBIC AND ANAEROBIC Blood Culture adequate volume   Culture   Final    NO GROWTH 3 DAYS Performed at [facility], [place] [address], [place], [place] [address]    Report Status PENDING  Incomplete  SARS Coronavirus 2 by RT PCR (hospital order, performed in [facility] hospital lab)     Status: None   Collection Time: 12/25/19 11:43 PM  Result Value Ref Range Status   SARS Coronavirus 2 NEGATIVE NEGATIVE Final    Comment: (NOTE) SARS-CoV-2 target nucleic acids are NOT DETECTED.  The SARS-CoV-2 RNA is generally detectable in upper and lower respiratory specimens during the acute phase of infection. The lowest concentration of SARS-CoV-2 viral copies this assay can detect is 250 copies / mL. A negative result does not preclude SARS-CoV-2 infection and should not be used as the sole basis for  treatment or other patient management decisions.  A negative result may occur with improper specimen collection / handling, submission of specimen other than nasopharyngeal swab, presence of viral mutation(s) within the areas targeted by this assay, and inadequate number of viral copies (<250 copies / mL). A negative result must be combined with clinical observations, patient history, and epidemiological information.  Fact Sheet for Patients:   StrictlyIdeas.no  Fact Sheet for Healthcare Providers: BankingDealers.co.za  This test is not yet approved or  cleared by the [place] FDA and has been authorized for detection and/or diagnosis of SARS-CoV-2 by FDA under an Emergency Use Authorization (EUA).  This EUA will remain in effect (meaning this test can be used) for the duration of the COVID-19 declaration under Section 564(b)(1) of the Act, 21 U.S.C. section 360bbb-3(b)(1), unless the authorization is terminated or revoked sooner.  Performed at [facility], [place] [address] [place], [place] [address]   Culture, sputum-assessment     Status: None   Collection Time: 12/26/19  9:32 AM   Specimen: Expectorated Sputum  Result Value Ref Range Status   Specimen Description EXPECTORATED SPUTUM  Final   Special Requests NONE  Final   Sputum evaluation   Final    THIS SPECIMEN IS ACCEPTABLE FOR SPUTUM CULTURE Performed at [facility], [place] [address], [place], [place] [address]    Report Status 12/26/2019 FINAL  Final  Culture, respiratory     Status: None   Collection Time: 12/26/19  9:32 AM  Result Value Ref Range Status   Specimen Description EXPECTORATED SPUTUM  Final   Special Requests NONE Reflexed from [id number]  Final   Gram Stain   Final    RARE WBC PRESENT, PREDOMINANTLY PMN RARE GRAM POSITIVE COCCI    Culture   Final    Consistent with normal respiratory flora. Performed at [facility], [place] [address] [place], [place] [address]    Report Status 12/28/2019 FINAL  Final  Culture, blood (Routine X 2) w Reflex to ID Panel  Status: None (Preliminary result)   Collection Time: 12/26/19 12:10 PM   Specimen: BLOOD LEFT HAND  Result Value Ref Range Status   Specimen Description BLOOD LEFT HAND  Final   Special Requests   Final    BOTTLES DRAWN AEROBIC ONLY Blood Culture adequate volume   Culture   Final    NO GROWTH 3 DAYS Performed at [facility], [address] [place], [place] [address]    Report Status PENDING  Incomplete  Culture, blood (Routine X 2) w Reflex to ID Panel     Status: None (Preliminary result)   Collection Time: 12/26/19 12:15 PM   Specimen: BLOOD RIGHT HAND  Result Value Ref Range Status   Specimen Description BLOOD RIGHT HAND  Final   Special Requests   Final    BOTTLES DRAWN AEROBIC ONLY Blood Culture results may not be optimal due to an inadequate volume of blood received in culture bottles   Culture   Final    NO GROWTH 3 DAYS Performed at [facility], [address] [place], [place] [address]    Report Status PENDING  Incomplete         Radiology Studies: No results found.      Scheduled Meds: . buprenorphine-naloxone  1 tablet Sublingual BID  . busPIRone  20 mg Oral BID  . enoxaparin (LOVENOX) injection  40 mg Subcutaneous Q24H  . FLUoxetine  40 mg Oral q morning - 10a  . mirtazapine  30 mg Oral QHS  . nicotine  14 mg Transdermal Daily  . pantoprazole  40 mg Oral Q0600  . sodium chloride flush  3 mL Intravenous Q12H   Continuous Infusions: . sodium chloride 250 mL (12/29/19 0610)  . piperacillin-tazobactam (ZOSYN)  IV 3.375 g (12/29/19 6314)  . vancomycin 1,250 mg (12/29/19 1140)     LOS: 4 days        Kathlen Mody, MD [facility]   To contact the attending provider between 7A-7P or the covering provider during after hours 7P-7A, please log into the web site [email or web address] and access using universal [facility] password for that web site. If you do not have the password, please call the hospital operator.  12/29/2019, 1:59 PM

## 2019-12-29 NOTE — Progress Notes (Signed)
Pharmacy Antibiotic Note  Daniel Trujillo is a 39 y.o. male admitted on 12/25/2019 with infectious myofascitis.  Pharmacy has been consulted for Vanco, Zosyn dosing.  Transfer from [place] c/o L knee pain with swelling and redness. CT at [facility]: Infectious myofascitis  Vancomycin trough this morning drawn 10 hours after last dose is 16. Given drawn a little late, true trough closer to 20. Creatinine is stable ~0.9, unchanged UOP. WBC count remains wnl at 8.0.   Plan: Continue vancomycin 1250 mg IV every 8 hours  Zosyn 3.375g IV q8hr Monitor cx results, clinical status, and vanc levels as appropriate  Follow up LOT- per MD at least 5 days   Height: 6\' 2"  (188 cm) Weight: 75.3 kg (166 lb) IBW/kg (Calculated) : 82.2  Temp (24hrs), Avg:98.5 F (36.9 C), Min:98.3 F (36.8 C), Max:98.7 F (37.1 C)  Recent Labs  Lab 12/25/19 2221 12/26/19 0305 12/27/19 0809 12/28/19 0501 12/29/19 0550  WBC  --  12.9* 10.7* 9.7 8.0  CREATININE  --  1.04 0.93 0.97 0.92  LATICACIDVEN 1.6  --   --   --   --   VANCOTROUGH  --   --  12*  --   --     Estimated Creatinine Clearance: 118.2 mL/min (by C-G formula based on SCr of 0.92 mg/dL).    Allergies  Allergen Reactions  . Naproxen Rash and Swelling    Other reaction(s): Joint Pain  . Carbamazepine Hives   Antimicrobials this admission:  Zosyn 6/16 >>  Vancomycin 6/17 >>   Dose adjustments this admission:  6/18- VT 12 (~8 hr after last dose): increase to 1250 mg IV every 8 hrs 6/20- VT 16 ~10 hour after last dose  Culture results:  6/17 BCx: ngtd 6/16 Bcx: ngtd 6/16  Resp Cx: normal flora   6/16 covid 19: neg 6/16 strep pnuemo and legionella negative  Thank you,   7/16, PharmD PGY-1 Pharmacy Resident   Please check amion for clinical pharmacist contact number

## 2019-12-29 NOTE — Progress Notes (Signed)
Pt refused nightly Lovenox, stating he was told he "didn't need to take it if he was walking during the day". Will continue monitoring.

## 2019-12-30 ENCOUNTER — Inpatient Hospital Stay (HOSPITAL_COMMUNITY): Payer: Self-pay

## 2019-12-30 DIAGNOSIS — M79609 Pain in unspecified limb: Secondary | ICD-10-CM

## 2019-12-30 DIAGNOSIS — R7401 Elevation of levels of liver transaminase levels: Secondary | ICD-10-CM

## 2019-12-30 DIAGNOSIS — A419 Sepsis, unspecified organism: Principal | ICD-10-CM

## 2019-12-30 DIAGNOSIS — M7989 Other specified soft tissue disorders: Secondary | ICD-10-CM

## 2019-12-30 LAB — COMPREHENSIVE METABOLIC PANEL
ALT: 149 U/L — ABNORMAL HIGH (ref 0–44)
AST: 119 U/L — ABNORMAL HIGH (ref 15–41)
Albumin: 2.7 g/dL — ABNORMAL LOW (ref 3.5–5.0)
Alkaline Phosphatase: 70 U/L (ref 38–126)
Anion gap: 7 (ref 5–15)
BUN: 9 mg/dL (ref 6–20)
CO2: 24 mmol/L (ref 22–32)
Calcium: 8.4 mg/dL — ABNORMAL LOW (ref 8.9–10.3)
Chloride: 108 mmol/L (ref 98–111)
Creatinine, Ser: 0.97 mg/dL (ref 0.61–1.24)
GFR calc Af Amer: >60 mL/min (ref >60)
GFR calc non Af Amer: >60 mL/min (ref >60)
Glucose, Bld: 101 mg/dL — ABNORMAL HIGH (ref 70–99)
Potassium: 3.7 mmol/L (ref 3.5–5.1)
Sodium: 139 mmol/L (ref 135–145)
Total Bilirubin: 0.5 mg/dL (ref 0.3–1.2)
Total Protein: 6.2 g/dL — ABNORMAL LOW (ref 6.5–8.1)

## 2019-12-30 LAB — CBC WITH DIFFERENTIAL/PLATELET
Abs Immature Granulocytes: 0.11 10*3/uL — ABNORMAL HIGH (ref 0.00–0.07)
Basophils Absolute: 0.1 10*3/uL (ref 0.0–0.1)
Basophils Relative: 1 %
Eosinophils Absolute: 0.3 10*3/uL (ref 0.0–0.5)
Eosinophils Relative: 4 %
HCT: 37 % — ABNORMAL LOW (ref 39.0–52.0)
Hemoglobin: 12.4 g/dL — ABNORMAL LOW (ref 13.0–17.0)
Immature Granulocytes: 1 %
Lymphocytes Relative: 18 %
Lymphs Abs: 1.4 10*3/uL (ref 0.7–4.0)
MCH: 30.4 pg (ref 26.0–34.0)
MCHC: 33.5 g/dL (ref 30.0–36.0)
MCV: 90.7 fL (ref 80.0–100.0)
Monocytes Absolute: 0.9 10*3/uL (ref 0.1–1.0)
Monocytes Relative: 12 %
Neutro Abs: 5.1 10*3/uL (ref 1.7–7.7)
Neutrophils Relative %: 64 %
Platelets: 259 10*3/uL (ref 150–400)
RBC: 4.08 MIL/uL — ABNORMAL LOW (ref 4.22–5.81)
RDW: 12.6 % (ref 11.5–15.5)
WBC: 7.8 10*3/uL (ref 4.0–10.5)
nRBC: 0 % (ref 0.0–0.2)

## 2019-12-30 LAB — C-REACTIVE PROTEIN: CRP: 2.2 mg/dL — ABNORMAL HIGH (ref <1.0)

## 2019-12-30 LAB — SEDIMENTATION RATE: Sed Rate: 38 mm/hr — ABNORMAL HIGH (ref 0–16)

## 2019-12-30 MED ORDER — LINEZOLID 600 MG PO TABS: 600.0000 mg | ORAL_TABLET | Freq: Two times a day (BID) | ORAL | 0 refills | Status: DC

## 2019-12-30 MED ORDER — PANTOPRAZOLE SODIUM 40 MG PO TBEC: 40.0000 mg | DELAYED_RELEASE_TABLET | Freq: Every day | ORAL | 1 refills | Status: DC

## 2019-12-30 MED ORDER — NICOTINE 14 MG/24HR TD PT24: 14.0000 mg | MEDICATED_PATCH | Freq: Every day | TRANSDERMAL | 0 refills | Status: DC

## 2019-12-30 MED ORDER — LINEZOLID 600 MG PO TABS
600.0000 mg | ORAL_TABLET | Freq: Two times a day (BID) | ORAL | Status: DC
  Filled 2019-12-30: qty 1

## 2019-12-30 MED FILL — LINEZOLID 600 MG TABS: 600 | 6 days supply | Qty: 12 | Fill #0

## 2019-12-30 NOTE — Progress Notes (Signed)
PT Cancellation Note  Patient Details Name: Daniel Trujillo MRN: [id number] DOB: Sep 27, 1980   Cancelled Treatment:    Reason Eval/Treat Not Completed: Patient declined, no reason specified. Patient reports he is being discharged today. Reports he has no further needs at this time.      [name] 12/30/2019, 2:56 PM

## 2019-12-30 NOTE — Progress Notes (Addendum)
Patient discharged: Home with family  Via: Wheelchair   Discharge paperwork given: to patient and family   TOC medication delivered  Reviewed with teach back  IV and telemetry disconnected  Belongings given to patient  Recources given by SW

## 2019-12-30 NOTE — Progress Notes (Signed)
Lower extremity venous LT study completed.   See Cv Proc for preliminary results.   Daniel Trujillo  

## 2019-12-30 NOTE — Progress Notes (Signed)
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 Blake Divine.  Daniel Trujillo, PharmD, BCIDP, AAHIVP, CPP Infectious Disease Pharmacist 12/30/2019 10:48 AM

## 2019-12-31 LAB — CULTURE, BLOOD (ROUTINE X 2)
Culture: NO GROWTH
Culture: NO GROWTH
Culture: NO GROWTH
Culture: NO GROWTH
Special Requests: ADEQUATE
Special Requests: ADEQUATE
Special Requests: ADEQUATE

## 2019-12-31 NOTE — Discharge Summary (Signed)
Physician Discharge Summary  Daniel Trujillo LKG:[id number] DOB: 07-02-1981 DOA: 12/25/2019  PCP: Carlus Pavlov, MD  Admit date: 12/25/2019 Discharge date: 12/30/2019  Admitted From: Home.  Disposition: Home.   Recommendations for Outpatient Follow-up:  1. Follow up with PCP in 1-2 weeks 2. Please obtain BMP/CBC in one week   Discharge Condition:STABLE.  CODE STATUS:FULL CODE.  Diet recommendation: Heart Healthy  Brief/Interim Summary: 39 year old male prior history of IV drug abuse, opiate dependence on Suboxone presents to ED for evaluation of fever associated with pain and redness of the left lower extremity.  Patient was seen at [facility] and transferred to [facility] for further evaluation and management of myositis.  CT of the left lower extremity demonstrate patchy hypodense appearance of the distal semimembranous and proximal gastrocnemius musculature with inflammatory stranding without any abscess or air.  CT angiogram of the chest shows groundglass opacities in the bilateral lungs consistent with atypical pneumonia. Covid test was negative.  Patient was started on broad-spectrum IV antibiotics.Recommend to complete at least 5 days of IV antibiotics followed by another 5 days of oral antibiotics to complete the course.   Discharge Diagnoses:  Principal Problem:   Infectious myositis Active Problems:   Sepsis ([place])   Multifocal pneumonia   IV drug abuse ([place])   Opiate dependence ([place])   Elevated liver transaminase level   Positive D dimer   Hyponatremia   Sepsis secondary to myofascitis involving semimembranosus and proximal gastrocnemius muscles, present on admission. In the setting of IV drug abuse. Patient was febrile on admission with elevated lactic acid, tachycardic and leukocytosis.  Sepsis physiology improving.  Patient is currently afebrile, improving leukocytosis, lactic acid has normalized. Continue with broad-spectrum IV antibiotics at this time  and trend inflammatory markers.   Blood cultures have been negative so far. CRP is improving. Afebrile , wbc wnl .   Recommend to complete atleast 5 days of broad spectrum IV antibiotics, before transitioning to oral antibiotics. Requested ID input and he was discharged on 6 more day sof oral linozolid to complete the course.  Venous duplex of the left lower extremity is negative for DVT.    Acute hypoxic respiratory failure with elevated D-dimer and CT angiogram suspicious for atypical pneumonia. Pt denies any cough or sob.  Continue broad-spectrum IV antibiotics, no cough or palpitations.  Pt denies any chest pain or sob.    Elevated transaminases, no symptoms of nausea or vomiting or abdominal pain. 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 [place] abd shows liver steatosis, pt denies any nausea, vomiting or abdominal pain.  Improving liver enzymes. Recommend outpatient follow up with liver enzymes in 4 weeks.    History of IV drug abuse with opiate dependence on Suboxone. Continue with Suboxone.  Hypokalemia:  Replaced.   Mild normocytic anemia. Hemoglobin stable between 11-12.  Discharge Instructions  Discharge Instructions    Diet - low sodium heart healthy   Complete by: As directed    Discharge instructions   Complete by: As directed    PLEASE follow up with PCP in one week.   Increase activity slowly   Complete by: As directed      Allergies as of 12/30/2019      Reactions   Naproxen Rash, Swelling   Other reaction(s): Joint Pain   Carbamazepine Hives      Medication List    TAKE these medications   buprenorphine-naloxone 8-2 mg Subl SL tablet Commonly known as: SUBOXONE Place 1 tablet  under the tongue 2 (two) times daily.   busPIRone 10 MG tablet Commonly known as: BUSPAR Take 20 mg by mouth 2 (two) times daily.   FLUoxetine 40 MG capsule Commonly known as: PROZAC Take 40 mg by mouth every morning.   linezolid 600 MG  tablet Commonly known as: ZYVOX Take 1 tablet (600 mg total) by mouth every 12 (twelve) hours.   mirtazapine 30 MG tablet Commonly known as: REMERON Take 30 mg by mouth at bedtime.   nicotine 14 mg/24hr patch Commonly known as: NICODERM CQ - dosed in mg/24 hours Place 1 patch (14 mg total) onto the skin daily.   pantoprazole 40 MG tablet Commonly known as: PROTONIX Take 1 tablet (40 mg total) by mouth daily at 6 (six) AM.       Follow-up Information    Desmond Dike, MD.   Specialty: Family Medicine Why: Please follow up in a week Contact information: [address] [place] [address] [phone]              Allergies  Allergen Reactions  . Naproxen Rash and Swelling    Other reaction(s): Joint Pain  . Carbamazepine Hives    Consultations:  Curbside with Dr Drue Second with ID.    Procedures/Studies: CT ANGIO CHEST PE W OR WO CONTRAST  Result Date: 12/26/2019 CLINICAL DATA:  Respiratory failure and IV drug abuse. EXAM: CT ANGIOGRAPHY CHEST WITH CONTRAST TECHNIQUE: Multidetector CT imaging of the chest was performed using the standard protocol during bolus administration of intravenous contrast. Multiplanar CT image reconstructions and MIPs were obtained to evaluate the vascular anatomy. CONTRAST:  75mL OMNIPAQUE IOHEXOL 350 MG/ML SOLN COMPARISON:  Chest CTA report 11/02/2013 FINDINGS: Cardiovascular: Normal heart size. No pericardial effusion. No pulmonary artery filling defect when allowing for areas of mild motion artifact. Normal aorta. Mediastinum/Nodes: No adenopathy or mass. Lungs/Pleura: Ground-glass opacity in the perihilar and subpleural lungs. History of IV drug abuse with no cavitary or nodular components. No pulmonary edema or significant pleural effusion Upper Abdomen: Negative Musculoskeletal: No acute or aggressive finding Review of the MIP images confirms the above findings. IMPRESSION: 1. Negative for pulmonary embolism. 2. Patchy ground-glass opacity  in the bilateral lung as seen with atypical pneumonia and ARDS. No nodular or cavitating airspace disease to correlate with IV drug abuse history. Electronically Signed   By: Marnee Spring M.D.   On: 12/26/2019 05:27   US Abdomen Limited  Result Date: 12/26/2019 CLINICAL DATA:  Elevated liver enzyme EXAM: ULTRASOUND ABDOMEN LIMITED RIGHT UPPER QUADRANT COMPARISON:  None. FINDINGS: Gallbladder: Slightly dilated but no shadowing stones. Negative sonographic Murphy. Normal wall thickness. Common bile duct: Diameter: 5 mm Liver: Liver is echogenic. No focal hepatic abnormality. Portal vein is patent on color Doppler imaging with normal direction of blood flow towards the liver. Other: None. IMPRESSION: 1. Echogenic liver suggesting steatosis. 2. Negative for gallstones or biliary dilatation Electronically Signed   By: Jasmine Pang M.D.   On: 12/26/2019 20:57   VAS [place] LOWER EXTREMITY VENOUS (DVT)  Result Date: 12/30/2019  Lower Venous DVTStudy Indications: Pain, Swelling, and Edema.  Comparison Study: No prior studies. Performing Technologist: Jean Rosenthal  Examination Guidelines: A complete evaluation includes B-mode imaging, spectral Doppler, color Doppler, and power Doppler as needed of all accessible portions of each vessel. Bilateral testing is considered an integral part of a complete examination. Limited examinations for reoccurring indications may be performed as noted. The reflux portion of the exam is performed with the patient in  reverse Trendelenburg.  +-----+---------------+---------+-----------+----------+--------------+ RIGHTCompressibilityPhasicitySpontaneityPropertiesThrombus Aging +-----+---------------+---------+-----------+----------+--------------+ CFV  Full           Yes      Yes                                 +-----+---------------+---------+-----------+----------+--------------+   +---------+---------------+---------+-----------+----------+--------------+ LEFT      CompressibilityPhasicitySpontaneityPropertiesThrombus Aging +---------+---------------+---------+-----------+----------+--------------+ CFV      Full           Yes      Yes                                 +---------+---------------+---------+-----------+----------+--------------+ SFJ      Full                                                        +---------+---------------+---------+-----------+----------+--------------+ FV Prox  Full                                                        +---------+---------------+---------+-----------+----------+--------------+ FV Mid   Full                                                        +---------+---------------+---------+-----------+----------+--------------+ FV DistalFull                                                        +---------+---------------+---------+-----------+----------+--------------+ PFV      Full                                                        +---------+---------------+---------+-----------+----------+--------------+ POP      Full           Yes      Yes                                 +---------+---------------+---------+-----------+----------+--------------+ PTV      Full                                                        +---------+---------------+---------+-----------+----------+--------------+ PERO     Full                                                        +---------+---------------+---------+-----------+----------+--------------+  Summary: RIGHT: - No evidence of common femoral vein obstruction.  LEFT: - There is no evidence of deep vein thrombosis in the lower extremity.  - No cystic structure found in the popliteal fossa.  *See table(s) above for measurements and observations. Electronically signed by Fabienne Bruns MD on 12/30/2019 at 3:35:53 PM.    Final        Subjective: No new complaints.   Discharge Exam: Vitals:   12/30/19 0251 12/30/19  1047  BP: 123/64 129/80  Pulse: 64 64  Resp: 17 18  Temp: 98.5 F (36.9 C) 98.6 F (37 C)  SpO2: 100% 100%   Vitals:   12/29/19 1730 12/29/19 1925 12/30/19 0251 12/30/19 1047  BP: 132/76 122/79 123/64 129/80  Pulse: 75 67 64 64  Resp: 20 17 17 18   Temp: 98.7 F (37.1 C) 98.5 F (36.9 C) 98.5 F (36.9 C) 98.6 F (37 C)  TempSrc: Oral Oral Oral Oral  SpO2: 100% 100% 100% 100%  Weight:   75.2 kg   Height:        General: Pt is alert, awake, not in acute distress Cardiovascular: RRR, S1/S2 +, no rubs, no gallops Respiratory: CTA bilaterally, no wheezing, no rhonchi Abdominal: Soft, NT, ND, bowel sounds + Extremities: no edema, no cyanosis    The results of significant diagnostics from this hospitalization (including imaging, microbiology, ancillary and laboratory) are listed below for reference.     Microbiology: Recent Results (from the past 240 hour(s))  Culture, blood (routine x 2)     Status: None   Collection Time: 12/25/19 10:26 PM   Specimen: BLOOD LEFT HAND  Result Value Ref Range Status   Specimen Description BLOOD LEFT HAND  Final   Special Requests   Final    BOTTLES DRAWN AEROBIC AND ANAEROBIC Blood Culture adequate volume   Culture   Final    NO GROWTH 5 DAYS Performed at [facility], [address], [place], [place]    Report Status 12/31/2019 FINAL  Final  Culture, blood (routine x 2)     Status: None   Collection Time: 12/25/19 10:33 PM   Specimen: BLOOD  Result Value Ref Range Status   Specimen Description BLOOD RIGHT ANTECUBITAL  Final   Special Requests   Final    BOTTLES DRAWN AEROBIC AND ANAEROBIC Blood Culture adequate volume   Culture   Final    NO GROWTH 5 DAYS Performed at [facility], [address], [place], [place]    Report Status 12/31/2019 FINAL  Final  SARS Coronavirus 2 by RT PCR (hospital order, performed in [facility] hospital lab)     Status: None   Collection Time: 12/25/19 11:43 PM   Result Value Ref Range Status   SARS Coronavirus 2 NEGATIVE NEGATIVE Final    Comment: (NOTE) SARS-CoV-2 target nucleic acids are NOT DETECTED.  The SARS-CoV-2 RNA is generally detectable in upper and lower respiratory specimens during the acute phase of infection. The lowest concentration of SARS-CoV-2 viral copies this assay can detect is 250 copies / mL. A negative result does not preclude SARS-CoV-2 infection and should not be used as the sole basis for treatment or other patient management decisions.  A negative result may occur with improper specimen collection / handling, submission of specimen other than nasopharyngeal swab, presence of viral mutation(s) within the areas targeted by this assay, and inadequate number of viral copies (<250 copies / mL). A negative result must be combined  with clinical observations, patient history, and epidemiological information.  Fact Sheet for Patients:   BoilerBrush.com.cy  Fact Sheet for Healthcare Providers: https://pope.com/  This test is not yet approved or  cleared by the [place] FDA and has been authorized for detection and/or diagnosis of SARS-CoV-2 by FDA under an Emergency Use Authorization (EUA).  This EUA will remain in effect (meaning this test can be used) for the duration of the COVID-19 declaration under Section 564(b)(1) of the Act, 21 U.S.C. section 360bbb-3(b)(1), unless the authorization is terminated or revoked sooner.  Performed at [facility], [address], [place], [place] [address]   Culture, sputum-assessment     Status: None   Collection Time: 12/26/19  9:32 AM   Specimen: Expectorated Sputum  Result Value Ref Range Status   Specimen Description EXPECTORATED SPUTUM  Final   Special Requests NONE  Final   Sputum evaluation   Final    THIS SPECIMEN IS ACCEPTABLE FOR SPUTUM CULTURE Performed at [facility], [address],  [place], [place] [address]    Report Status 12/26/2019 FINAL  Final  Culture, respiratory     Status: None   Collection Time: 12/26/19  9:32 AM  Result Value Ref Range Status   Specimen Description EXPECTORATED SPUTUM  Final   Special Requests NONE Reflexed from [id number]  Final   Gram Stain   Final    RARE WBC PRESENT, PREDOMINANTLY PMN RARE GRAM POSITIVE COCCI    Culture   Final    Consistent with normal respiratory flora. Performed at [facility], [address] [place], [place] [address]    Report Status 12/28/2019 FINAL  Final  Culture, blood (Routine X 2) w Reflex to ID Panel     Status: None   Collection Time: 12/26/19 12:10 PM   Specimen: BLOOD LEFT HAND  Result Value Ref Range Status   Specimen Description BLOOD LEFT HAND  Final   Special Requests   Final    BOTTLES DRAWN AEROBIC ONLY Blood Culture adequate volume   Culture   Final    NO GROWTH 5 DAYS Performed at [facility], [address] [place], [place] [address]    Report Status 12/31/2019 FINAL  Final  Culture, blood (Routine X 2) w Reflex to ID Panel     Status: None   Collection Time: 12/26/19 12:15 PM   Specimen: BLOOD RIGHT HAND  Result Value Ref Range Status   Specimen Description BLOOD RIGHT HAND  Final   Special Requests   Final    BOTTLES DRAWN AEROBIC ONLY Blood Culture results may not be optimal due to an inadequate volume of blood received in culture bottles   Culture   Final    NO GROWTH 5 DAYS Performed at [facility], [address] [place], [place] [address]    Report Status 12/31/2019 FINAL  Final     Labs: BNP (last 3 results) No results for input(s): BNP in the last 8760 hours. Basic Metabolic Panel: Recent Labs  Lab 12/26/19 0305 12/27/19 0809 12/28/19 0501 12/29/19 0550 12/30/19 0428  NA 136 138 139 138 139  K 3.6 3.3* 4.2 3.2* 3.7  CL 101 105 107 106 108  CO2 28 24 23 24 24   GLUCOSE 132* 152* 96 101* 101*  BUN 9 12 11 10 9   CREATININE 1.04 0.93 0.97 0.92  0.97  CALCIUM 8.2* 8.4* 8.4* 8.4* 8.4*   Liver Function Tests: Recent Labs  Lab 12/26/19 0305 12/27/19 0809 12/28/19 0501 12/29/19 0550 12/30/19 0428  AST 687* 363* 281* 179* 119*  ALT 254* 222* 209* 172* 149*  ALKPHOS 58 56 66 59 70  BILITOT 0.9 0.9 1.2 0.6 0.5  PROT 6.1* 6.4* 6.6 6.0* 6.2*  ALBUMIN 2.8* 2.7* 2.8* 2.6* 2.7*   No results for input(s): LIPASE, AMYLASE in the last 168 hours. No results for input(s): AMMONIA in the last 168 hours. CBC: Recent Labs  Lab 12/26/19 0305 12/27/19 0809 12/28/19 0501 12/29/19 0550 12/30/19 0428  WBC 12.9* 10.7* 9.7 8.0 7.8  NEUTROABS 9.9* 8.3* 6.8 5.5 5.1  HGB 12.0* 12.1* 12.9* 11.8* 12.4*  HCT 35.6* 36.5* 40.0 35.3* 37.0*  MCV 90.6 91.7 92.8 90.5 90.7  PLT 179 218 247 235 259   Cardiac Enzymes: No results for input(s): CKTOTAL, CKMB, CKMBINDEX, TROPONINI in the last 168 hours. BNP: Invalid input(s): POCBNP CBG: No results for input(s): GLUCAP in the last 168 hours. D-Dimer No results for input(s): DDIMER in the last 72 hours. Hgb A1c No results for input(s): HGBA1C in the last 72 hours. Lipid Profile No results for input(s): CHOL, HDL, LDLCALC, TRIG, CHOLHDL, LDLDIRECT in the last 72 hours. Thyroid function studies No results for input(s): TSH, T4TOTAL, T3FREE, THYROIDAB in the last 72 hours.  Invalid input(s): FREET3 Anemia work up No results for input(s): VITAMINB12, FOLATE, FERRITIN, TIBC, IRON, RETICCTPCT in the last 72 hours. Urinalysis No results found for: COLORURINE, APPEARANCEUR, LABSPEC, PHURINE, GLUCOSEU, HGBUR, BILIRUBINUR, KETONESUR, PROTEINUR, UROBILINOGEN, NITRITE, LEUKOCYTESUR Sepsis Labs Invalid input(s): PROCALCITONIN,  WBC,  LACTICIDVEN Microbiology Recent Results (from the past 240 hour(s))  Culture, blood (routine x 2)     Status: None   Collection Time: 12/25/19 10:26 PM   Specimen: BLOOD LEFT HAND  Result Value Ref Range Status   Specimen Description BLOOD LEFT HAND  Final   Special  Requests   Final    BOTTLES DRAWN AEROBIC AND ANAEROBIC Blood Culture adequate volume   Culture   Final    NO GROWTH 5 DAYS Performed at [facility], [address] [place], [place] [address]    Report Status 12/31/2019 FINAL  Final  Culture, blood (routine x 2)     Status: None   Collection Time: 12/25/19 10:33 PM   Specimen: BLOOD  Result Value Ref Range Status   Specimen Description BLOOD RIGHT ANTECUBITAL  Final   Special Requests   Final    BOTTLES DRAWN AEROBIC AND ANAEROBIC Blood Culture adequate volume   Culture   Final    NO GROWTH 5 DAYS Performed at [facility], [address] [place], [place] [address]    Report Status 12/31/2019 FINAL  Final  SARS Coronavirus 2 by RT PCR (hospital order, performed in [facility] hospital lab)     Status: None   Collection Time: 12/25/19 11:43 PM  Result Value Ref Range Status   SARS Coronavirus 2 NEGATIVE NEGATIVE Final    Comment: (NOTE) SARS-CoV-2 target nucleic acids are NOT DETECTED.  The SARS-CoV-2 RNA is generally detectable in upper and lower respiratory specimens during the acute phase of infection. The lowest concentration of SARS-CoV-2 viral copies this assay can detect is 250 copies / mL. A negative result does not preclude SARS-CoV-2 infection and should not be used as the sole basis for treatment or other patient management decisions.  A negative result may occur with improper specimen collection / handling, submission of specimen other than nasopharyngeal swab, presence of viral mutation(s)  within the areas targeted by this assay, and inadequate number of viral copies (<250 copies / mL). A negative result must be combined with clinical observations, patient history, and epidemiological information.  Fact Sheet for Patients:   BoilerBrush.com.cy  Fact Sheet for Healthcare Providers: https://pope.com/  This test is not yet approved or  cleared by the  [place] FDA and has been authorized for detection and/or diagnosis of SARS-CoV-2 by FDA under an Emergency Use Authorization (EUA).  This EUA will remain in effect (meaning this test can be used) for the duration of the COVID-19 declaration under Section 564(b)(1) of the Act, 21 U.S.C. section 360bbb-3(b)(1), unless the authorization is terminated or revoked sooner.  Performed at [facility], [address], [place], [place] [address]   Culture, sputum-assessment     Status: None   Collection Time: 12/26/19  9:32 AM   Specimen: Expectorated Sputum  Result Value Ref Range Status   Specimen Description EXPECTORATED SPUTUM  Final   Special Requests NONE  Final   Sputum evaluation   Final    THIS SPECIMEN IS ACCEPTABLE FOR SPUTUM CULTURE Performed at [facility], [address] [place], [place] [address]    Report Status 12/26/2019 FINAL  Final  Culture, respiratory     Status: None   Collection Time: 12/26/19  9:32 AM  Result Value Ref Range Status   Specimen Description EXPECTORATED SPUTUM  Final   Special Requests NONE Reflexed from [id number]  Final   Gram Stain   Final    RARE WBC PRESENT, PREDOMINANTLY PMN RARE GRAM POSITIVE COCCI    Culture   Final    Consistent with normal respiratory flora. Performed at [facility], [address] [place], [place] [address]    Report Status 12/28/2019 FINAL  Final  Culture, blood (Routine X 2) w Reflex to ID Panel     Status: None   Collection Time: 12/26/19 12:10 PM   Specimen: BLOOD LEFT HAND  Result Value Ref Range Status   Specimen Description BLOOD LEFT HAND  Final   Special Requests   Final    BOTTLES DRAWN AEROBIC ONLY Blood Culture adequate volume   Culture   Final    NO GROWTH 5 DAYS Performed at [facility], [address] [place], [place] [address]    Report Status 12/31/2019 FINAL  Final  Culture, blood (Routine X 2) w Reflex to ID Panel     Status: None   Collection Time: 12/26/19  12:15 PM   Specimen: BLOOD RIGHT HAND  Result Value Ref Range Status   Specimen Description BLOOD RIGHT HAND  Final   Special Requests   Final    BOTTLES DRAWN AEROBIC ONLY Blood Culture results may not be optimal due to an inadequate volume of blood received in culture bottles   Culture   Final    NO GROWTH 5 DAYS Performed at [facility], [address], [place], [place] [address]    Report Status 12/31/2019 FINAL  Final     Time coordinating discharge: 33 minutes.   SIGNED:   Kathlen Mody, MD  [facility] 12/31/2019, 10:18 AM

## 2020-01-03 LAB — HCV RNA QUANT RFLX ULTRA OR GENOTYP
HCV RNA Qnt(log copy/mL): 6.888 log10 IU/mL
HepC Qn: 7730000 IU/mL

## 2020-01-03 LAB — HEPATITIS C GENOTYPE: Hepatitis C Genotype: 3

## 2020-03-30 ENCOUNTER — Telehealth: Payer: Self-pay

## 2020-03-30 NOTE — Telephone Encounter (Signed)
RCID Patient Product/process development scientist completed.    The patient is insured through [employer]. Mavyret is covered but will need a PA. [employer] is not covered .  We will continue to follow to see if copay assistance is needed.  Clearance Coots, CPhT Specialty Pharmacy Patient [facility] Phone: [phone] Fax:  [phone]

## 2020-03-31 ENCOUNTER — Encounter: Payer: Medicaid Other | Admitting: Infectious Diseases

## 2020-04-24 ENCOUNTER — Telehealth: Payer: Self-pay

## 2020-04-24 NOTE — Telephone Encounter (Signed)
RCID Patient Product/process development scientist completed.    The patient is insured through [employer].  Insurance will pay for MAVYRET with a PA . I will need to get information from pt.   We will continue to follow to see if copay assistance is needed.  Clearance Coots, CPhT Specialty Pharmacy Patient [facility] Phone: [phone] Fax:  [phone]

## 2020-04-27 ENCOUNTER — Encounter: Payer: Medicaid Other | Admitting: Internal Medicine

## 2020-04-28 ENCOUNTER — Encounter: Payer: Self-pay | Admitting: Internal Medicine

## 2020-04-28 ENCOUNTER — Ambulatory Visit (INDEPENDENT_AMBULATORY_CARE_PROVIDER_SITE_OTHER): Payer: 59 | Admitting: Internal Medicine

## 2020-04-28 ENCOUNTER — Other Ambulatory Visit: Payer: Self-pay

## 2020-04-28 VITALS — BP 118/78 | HR 83 | Temp 97.8°F | Ht 74.0 in | Wt 164.0 lb

## 2020-04-28 DIAGNOSIS — B182 Chronic viral hepatitis C: Secondary | ICD-10-CM | POA: Insufficient documentation

## 2020-04-28 NOTE — Progress Notes (Signed)
[facility]   Reason for Consult: Consideration for treatment for chronic hepatitis C Referring Provider: Dr Sheria Lang  HPI:   Daniel Trujillo is a 39 y.o. male who presents for initial evaluation and management of chronic hepatitis C.   Past medical history as below.  Initially diagnosed 2016, treated with Epclusa in 2018 with SVR at [facility] based on labs 02/08/2017. Relapsed with IVDU. Rediagnosed in June 2021.  Now clean again for several months.  Living with grandfather in [place].  Working as a Research scientist (medical).  Patient tested positive June 2021.  Hepatitis C-associated risk factors present are: IV drug use).  Patient denies history of blood transfusion.  Patient has had other studies performed.  Results: hepatitis C RNA by PCR, result: positive.  Patient has had prior treatment for Hepatitis C.  Patient does not have a past history of liver disease.  Patient does not have a family history of liver disease.  Patient does not  have associated signs or symptoms related to liver disease.   Labs reviewed and confirm chronic hepatitis C with a positive viral load.    Records reviewed from Epic.  Patient does not have documented immunity to Hepatitis A. Patient does not have documented immunity to Hepatitis B.    Patient's Medications  New Prescriptions   No medications on file  Previous Medications   BUPRENORPHINE-NALOXONE (SUBOXONE) 8-2 MG SUBL SL TABLET    Place 1 tablet under the tongue 2 (two) times daily.   CLONAZEPAM (KLONOPIN) 0.5 MG TABLET    Take 0.5 mg by mouth 2 (two) times daily.   CYCLOBENZAPRINE (FLEXERIL) 10 MG TABLET    Take 10 mg by mouth at bedtime.   GABAPENTIN (NEURONTIN) 400 MG CAPSULE    Take 400 mg by mouth 3 (three) times daily.   MIRTAZAPINE (REMERON) 30 MG TABLET    Take 30 mg by mouth at bedtime.   PANTOPRAZOLE (PROTONIX) 40 MG TABLET    Take 1 tablet (40 mg total) by mouth daily at 6 (six) AM.  Modified Medications   No  medications on file  Discontinued Medications   BUSPIRONE (BUSPAR) 10 MG TABLET    Take 20 mg by mouth 2 (two) times daily.   FLUOXETINE (PROZAC) 40 MG CAPSULE    Take 40 mg by mouth every morning.   LINEZOLID (ZYVOX) 600 MG TABLET    Take 1 tablet (600 mg total) by mouth every 12 (twelve) hours.   NICOTINE (NICODERM CQ - DOSED IN MG/24 HOURS) 14 MG/24HR PATCH    Place 1 patch (14 mg total) onto the skin daily.      Past Medical History:  Diagnosis Date  . IV drug abuse (HCC)   . Viral hepatitis     Social History   Tobacco Use  . Smoking status: Current Every Day Smoker    Packs/day: 1.00  . Smokeless tobacco: Never Used  Substance Use Topics  . Alcohol use: Not Currently  . Drug use: Not Currently    Types: Heroin    Family History  Problem Relation Age of Onset  . Hypertension Mother   . Stroke Maternal Grandmother     Allergies  Allergen Reactions  . Naproxen Rash and Swelling    Other reaction(s): Joint Pain  . Carbamazepine Hives      Review of Systems: Review of Systems  Constitutional: Negative.   Respiratory: Negative.   Cardiovascular: Negative.   Gastrointestinal: Negative.  Objective: Vitals:   04/28/20 1034  BP: 118/78  Pulse: 83  Temp: 97.8 F (36.6 C)  TempSrc: Oral  SpO2: 100%  Weight: 164 lb (74.4 kg)  Height: 6\' 2"  (1.88 m)     Body mass index is 21.06 kg/m.  Physical Exam Constitutional:      General: He is not in acute distress.    Appearance: Normal appearance. He is normal weight.  Pulmonary:     Effort: Pulmonary effort is normal. No respiratory distress.  Skin:    General: Skin is warm and dry.     Coloration: Skin is not jaundiced.  Neurological:     General: No focal deficit present.     Mental Status: He is alert and oriented to person, place, and time.     Pertinent Labs and Microbiology:   Genotype: 3 HCV viral load: 7,730,000  HIV negative 12/25/19  Lab Results  Component Value Date   WBC 7.8  12/30/2019   HGB 12.4 (L) 12/30/2019   HCT 37.0 (L) 12/30/2019   MCV 90.7 12/30/2019   PLT 259 12/30/2019    Lab Results  Component Value Date   CREATININE 0.97 12/30/2019   BUN 9 12/30/2019   NA 139 12/30/2019   K 3.7 12/30/2019   CL 108 12/30/2019   CO2 24 12/30/2019    Lab Results  Component Value Date   ALT 149 (H) 12/30/2019   AST 119 (H) 12/30/2019   ALKPHOS 70 12/30/2019     Lab Results  Component Value Date   INR 1.1 12/26/2019   BILITOT 0.5 12/30/2019   ALBUMIN 2.7 (L) 12/30/2019    Labs and history reviewed and show FIB-4 score: 1.36  Interpretation: Using a lower cutoff value of 1.45, a FIB-4 score <1.45 had a negative predictive value of 90% for advanced fibrosis (Ishak fibrosis score 4-6 which includes early bridging fibrosis to cirrhosis). In contrast, a FIB-4 >3.25 would have a 97% specificity and a positive predictive value of 65% for advanced fibrosis. In the patient cohort in which this formula was first validated, at least 70% patients had values <1.45 or >3.25. Authors argued that these individuals could potentially have avoided liver biopsy with an overall accuracy of 86%.   Assessment and Plan: Chronic hepatitis C without hepatic coma (HCC) New Patient with Chronic Hepatitis C genotype 3.   I discussed with the patient the lab findings that confirm chronic hepatitis C.  I discussed the pathogenesis, transmission, prevention, risks of left untreated, and treatment options for hepatitis C. I  discussed the importance and benefits of treatment.  Plan: -- Patient counseled on limiting acetaminophen, avoidance of alcohol. -- Transmission discussed with patient including sexual transmission, injection drug use, sharing razors and toothbrush.   -- Will need referral to gastroenterology if concern for cirrhosis -- Will prescribe appropriate medication based on genotype and coverage  -- Hepatitis A and B titers with vaccination as needed -- Pneumococcal  vaccine if not previously given and evidence of cirrhosis -- Further work up to include liver staging with Fibrosis scoring and calculated FIB-4 score.  Elastography if concern for advanced fibrosis and/or cirrhosis -- Will follow up after starting medication -- Labs:  CBC CMP PT/INR Hep A and B serologies  Measure of fibrosis     Orders Placed This Encounter  Procedures  . Hepatitis B core antibody, total  . Hepatitis B surface antibody,qualitative  . Hepatitis B surface antigen  . Liver Fibrosis, FibroTest-ActiTest  . Protime-INR  . CBC  .  COMPLETE METABOLIC PANEL WITH GFR  . Hepatitis A antibody, total        Lady Deutscher [facility] 04/28/2020, 11:01 AM

## 2020-04-28 NOTE — Assessment & Plan Note (Addendum)
New Patient with Chronic Hepatitis C genotype 3.   I discussed with the patient the lab findings that confirm chronic hepatitis C.  I discussed the pathogenesis, transmission, prevention, risks of left untreated, and treatment options for hepatitis C. I  discussed the importance and benefits of treatment.  Plan: -- Patient counseled on limiting acetaminophen, avoidance of alcohol. -- Transmission discussed with patient including sexual transmission, injection drug use, sharing razors and toothbrush.   -- Will need referral to gastroenterology if concern for cirrhosis -- Will prescribe appropriate medication based on genotype and coverage  -- Hepatitis A and B titers with vaccination as needed -- Pneumococcal vaccine if not previously given and evidence of cirrhosis -- Further work up to include liver staging with Fibrosis scoring and calculated FIB-4 score.  Elastography if concern for advanced fibrosis and/or cirrhosis -- Will follow up after starting medication -- Labs:  CBC CMP PT/INR Hep A and B serologies  Measure of fibrosis

## 2020-04-28 NOTE — Patient Instructions (Signed)
Great to meet you today.  Once I have the results of your testing back, we will arrange for you to start medications.  Take care.

## 2020-05-05 LAB — LIVER FIBROSIS, FIBROTEST-ACTITEST
ALT: 134 U/L — ABNORMAL HIGH (ref 9–46)
Alpha-2-Macroglobulin: 184 mg/dL (ref 106–279)
Apolipoprotein A1: 152 mg/dL (ref 94–176)
Bilirubin: 0.3 mg/dL (ref 0.2–1.2)
Fibrosis Score: 0.15
GGT: 38 U/L (ref 3–90)
Haptoglobin: 70 mg/dL (ref 43–212)
Necroinflammat ACT Score: 0.63
Reference ID: 3602761

## 2020-05-05 LAB — COMPLETE METABOLIC PANEL WITH GFR
AG Ratio: 1.7 (calc) (ref 1.0–2.5)
ALT: 135 U/L — ABNORMAL HIGH (ref 9–46)
AST: 73 U/L — ABNORMAL HIGH (ref 10–40)
Albumin: 4.7 g/dL (ref 3.6–5.1)
Alkaline phosphatase (APISO): 71 U/L (ref 36–130)
BUN: 13 mg/dL (ref 7–25)
CO2: 28 mmol/L (ref 20–32)
Calcium: 10.1 mg/dL (ref 8.6–10.3)
Chloride: 103 mmol/L (ref 98–110)
Creat: 0.75 mg/dL (ref 0.60–1.35)
GFR, Est African American: 137 mL/min/{1.73_m2} (ref >=60)
GFR, Est Non African American: 118 mL/min/{1.73_m2} (ref >=60)
Globulin: 2.8 g/dL (calc) (ref 1.9–3.7)
Glucose, Bld: 91 mg/dL (ref 65–99)
Potassium: 4.6 mmol/L (ref 3.5–5.3)
Sodium: 137 mmol/L (ref 135–146)
Total Bilirubin: 0.4 mg/dL (ref 0.2–1.2)
Total Protein: 7.5 g/dL (ref 6.1–8.1)

## 2020-05-05 LAB — CBC
HCT: 45.1 % (ref 38.5–50.0)
Hemoglobin: 15 g/dL (ref 13.2–17.1)
MCH: 29.9 pg (ref 27.0–33.0)
MCHC: 33.3 g/dL (ref 32.0–36.0)
MCV: 90 fL (ref 80.0–100.0)
MPV: 11.9 fL (ref 7.5–12.5)
Platelets: 187 10*3/uL (ref 140–400)
RBC: 5.01 10*6/uL (ref 4.20–5.80)
RDW: 13.5 % (ref 11.0–15.0)
WBC: 6.8 10*3/uL (ref 3.8–10.8)

## 2020-05-05 LAB — PROTIME-INR
INR: 1
Prothrombin Time: 10.3 s (ref 9.0–11.5)

## 2020-05-05 LAB — HEPATITIS B CORE ANTIBODY, TOTAL: Hep B Core Total Ab: NONREACTIVE

## 2020-05-05 LAB — HEPATITIS B SURFACE ANTIGEN: Hepatitis B Surface Ag: NONREACTIVE

## 2020-05-05 LAB — HEPATITIS B SURFACE ANTIBODY,QUALITATIVE: Hep B S Ab: REACTIVE — AB

## 2020-05-05 LAB — HIV ANTIBODY (ROUTINE TESTING W REFLEX): HIV 1&2 Ab, 4th Generation: NONREACTIVE

## 2020-05-05 LAB — HEPATITIS A ANTIBODY, TOTAL: Hepatitis A AB,Total: REACTIVE — AB

## 2020-05-11 ENCOUNTER — Telehealth: Payer: Self-pay

## 2020-05-11 ENCOUNTER — Other Ambulatory Visit: Payer: Self-pay | Admitting: Pharmacist

## 2020-05-11 DIAGNOSIS — B182 Chronic viral hepatitis C: Secondary | ICD-10-CM

## 2020-05-11 MED ORDER — MAVYRET 100-40 MG PO TABS: 3.0000 | ORAL_TABLET | Freq: Every day | ORAL | 1 refills | Status: DC

## 2020-05-11 NOTE — Telephone Encounter (Signed)
[facility] Patient Advocate Encounter   Received notification from [employer] that prior authorization for MAVYRET is required.   PA submitted on 05/11/20 Key [id number] Status is pending    [facility] will continue to follow.   Clearance Coots, CPhT Specialty Pharmacy Patient [facility] for Infectious Disease Phone: [phone] Fax:  [phone]

## 2020-05-11 NOTE — Progress Notes (Signed)
Sending Mavyret x 8 week to [facility] for patient's Hepatitis C infection. We will start working on PA process.

## 2020-05-14 ENCOUNTER — Other Ambulatory Visit: Payer: Self-pay | Admitting: Pharmacist

## 2020-05-14 ENCOUNTER — Telehealth: Payer: Self-pay

## 2020-05-14 DIAGNOSIS — B182 Chronic viral hepatitis C: Secondary | ICD-10-CM

## 2020-05-14 MED ORDER — MAVYRET 100-40 MG PO TABS: 3.0000 | ORAL_TABLET | Freq: Every day | ORAL | 1 refills | Status: DC

## 2020-05-14 NOTE — Progress Notes (Signed)
Patient's insurance requires Mavyret to be filled at [facility]. Resending Rx now. Lupita Leash will call patient and coordinate.

## 2020-05-14 NOTE — Telephone Encounter (Signed)
[facility] Patient Advocate Encounter  Prior Authorization for Mavyret has been approved.     Effective dates: 05/14/20 through 09/03/20  Medication have to be filled with [facility].  I will contact patient to see if need any assistance with his co-pay.  [facility] will continue to follow.  Clearance Coots, CPhT Specialty Pharmacy Patient [facility] for Infectious Disease Phone: [phone] Fax:  [phone]

## 2020-05-21 ENCOUNTER — Telehealth: Payer: Self-pay | Admitting: *Deleted

## 2020-05-21 NOTE — Telephone Encounter (Signed)
Patient returning Donna's call. He has not yet heard from [employer].   Transferred call to Lupita Leash. Andree Coss, RN

## 2020-05-25 ENCOUNTER — Telehealth: Payer: Self-pay | Admitting: Pharmacist

## 2020-05-25 ENCOUNTER — Telehealth: Payer: Self-pay

## 2020-05-25 NOTE — Telephone Encounter (Signed)
Patient is approved to receive Mavyret x 8 weeks for chronic Hepatitis C infection. Counseled patient to take all three tablets of Mavyret daily with food.  Counseled patient the need to take all three tablets together and to not separate them out during the day. Encouraged patient not to miss any doses and explained how their chance of cure could go down with each dose missed. Counseled patient on common side effects such as headache, fatigue, and nausea and that these normally decrease with time. I reviewed patient medications and found no drug interactions. Discussed with patient that there are several drug interactions with Mavyret and instructed patient to call the clinic if he wishes to start a new medication during course of therapy. Also advised patient to call if he experiences any side effects. He is waiting to receive his first month of medication. Patient will follow-up with Cassie in the pharmacy clinic on 12/20.

## 2020-05-25 NOTE — Telephone Encounter (Signed)
[facility] Patient Advocate Encounter   I was successful in securing patient an $6000.00 grant from Patient [facility]) to provide copayment coverage for Mavyret.  This will make the out of pocket cost $0.00.     I have spoken with the patient.     Member ID: [id number] Group ID: [id number] RxBin: [id number] Dates of Eligibility: 05/24/20 through 05/24/21  Patient knows to call the office with questions or concerns.  Clearance Coots, CPhT Specialty Pharmacy Patient [facility] for Infectious Disease Phone: [phone] Fax:  [phone]

## 2020-06-18 ENCOUNTER — Telehealth: Payer: Self-pay

## 2020-06-18 NOTE — Telephone Encounter (Signed)
Received call from [facility] specialty pharmacy informing RN that patient reported nausea, vomitting and diarrhea during the first 2 weeks of beginning treatment. Patient has continued medication and doesn't report any further side effects. Forwarding to pharmacist.   Rosanna Randy, RN

## 2020-06-29 ENCOUNTER — Ambulatory Visit (INDEPENDENT_AMBULATORY_CARE_PROVIDER_SITE_OTHER): Payer: 59 | Admitting: Pharmacist

## 2020-06-29 ENCOUNTER — Ambulatory Visit: Payer: 59 | Admitting: Pharmacist

## 2020-06-29 ENCOUNTER — Other Ambulatory Visit: Payer: Self-pay

## 2020-06-29 DIAGNOSIS — B182 Chronic viral hepatitis C: Secondary | ICD-10-CM

## 2020-06-29 NOTE — Patient Instructions (Addendum)
[facility]  [address], [place], [place] [address]  Friday; 08/14/20 at 930am

## 2020-06-29 NOTE — Progress Notes (Signed)
HPI: Bobak Oguinn is a 39 y.o. male who presents to the [facility] pharmacy clinic for Hepatitis C follow-up.  Medication: Mavyret x 8 weeks  Start Date: 05/29/20  Hepatitis C Genotype: 3  Fibrosis Score: F0  Hepatitis C RNA: 7.7 million on 12/30/19  Patient Active Problem List   Diagnosis Date Noted  . Chronic hepatitis C without hepatic coma (HCC) 04/28/2020  . IV drug abuse (HCC) 12/25/2019  . Opiate dependence (HCC) 12/25/2019  . Elevated liver transaminase level 12/25/2019    Patient's Medications  New Prescriptions   No medications on file  Previous Medications   BUPRENORPHINE-NALOXONE (SUBOXONE) 8-2 MG SUBL SL TABLET    Place 1 tablet under the tongue 2 (two) times daily.   CLONAZEPAM (KLONOPIN) 0.5 MG TABLET    Take 0.5 mg by mouth 2 (two) times daily.   CYCLOBENZAPRINE (FLEXERIL) 10 MG TABLET    Take 10 mg by mouth at bedtime.   GABAPENTIN (NEURONTIN) 400 MG CAPSULE    Take 400 mg by mouth 3 (three) times daily.   GLECAPREVIR-PIBRENTASVIR (MAVYRET) 100-40 MG TABS    Take 3 tablets by mouth daily with breakfast.   MIRTAZAPINE (REMERON) 30 MG TABLET    Take 30 mg by mouth at bedtime.   PANTOPRAZOLE (PROTONIX) 40 MG TABLET    Take 1 tablet (40 mg total) by mouth daily at 6 (six) AM.  Modified Medications   No medications on file  Discontinued Medications   No medications on file    Allergies: Allergies  Allergen Reactions  . Naproxen Rash and Swelling    Other reaction(s): Joint Pain  . Carbamazepine Hives    Past Medical History: Past Medical History:  Diagnosis Date  . IV drug abuse (HCC)   . Viral hepatitis     Social History: Social History   Socioeconomic History  . Marital status: Single    Spouse name: Not on file  . Number of children: Not on file  . Years of education: Not on file  . Highest education level: Not on file  Occupational History  . Not on file  Tobacco Use  . Smoking status: Current Every Day Smoker    Packs/day: 1.00  .  Smokeless tobacco: Never Used  Substance and Sexual Activity  . Alcohol use: Not Currently  . Drug use: Not Currently    Types: Heroin  . Sexual activity: Not on file  Other Topics Concern  . Not on file  Social History Narrative  . Not on file   Social Determinants of Health   Financial Resource Strain: Not on file  Food Insecurity: Not on file  Transportation Needs: Not on file  Physical Activity: Not on file  Stress: Not on file  Social Connections: Not on file    Labs: Hepatitis C Lab Results  Component Value Date   HCVGENOTYPE Comment 12/30/2019   FIBROSTAGE F0 04/28/2020   Hepatitis B Lab Results  Component Value Date   HEPBSAB REACTIVE (A) 04/28/2020   HEPBSAG NON-REACTIVE 04/28/2020   HEPBCAB NON-REACTIVE 04/28/2020   Hepatitis A Lab Results  Component Value Date   HAV REACTIVE (A) 04/28/2020   HIV Lab Results  Component Value Date   HIV NON-REACTIVE 04/28/2020   HIV Non Reactive 12/25/2019   Lab Results  Component Value Date   CREATININE 0.75 04/28/2020   CREATININE 0.97 12/30/2019   CREATININE 0.92 12/29/2019   CREATININE 0.97 12/28/2019   CREATININE 0.93 12/27/2019   Lab Results  Component Value Date  AST 73 (H) 04/28/2020   AST 119 (H) 12/30/2019   AST 179 (H) 12/29/2019   ALT 134 (H) 04/28/2020   ALT 135 (H) 04/28/2020   ALT 149 (H) 12/30/2019   INR 1.0 04/28/2020   INR 1.1 12/26/2019    Assessment: Carlee is here today to follow up for his chronic Hepatitis C infection. He started 8 weeks of Mavyret therapy on 11/19. He is having some trouble tolerating the medication. He has stopped taking it twice for 2-3 days at a time because it causes him to lose his appetite. It also causes some nausea.  Advised him that it is best to take this consistently and to not miss doses or stop taking for days at a time for him to have the best chance at cure. He is taking zofran that helps with the nausea but not his appetite. He is asking for an  appetite stimulant, but I told him that the best thing for him to do is power through and finish out his therapy. He has 5 days left of his 1st month and will start his 2nd and final month after that. He already received the shipment.   Asked him to not miss anymore doses while on treatment. I will check labs today and have him see Dr. Earlene Plater for his cure visit in April.   Of note, he has documented immunity against Hepatitis A and B.  Plan: - Continue with Mavyret for 8 weeks - No more missed doses - Hep C RNA and CMET today - F/u with Dr. Earlene Plater 4/25 at 230pm  Akon Reinoso L. Jeane Cashatt, PharmD, BCIDP, AAHIVP, CPP Clinical Pharmacist Practitioner Infectious Diseases Clinical Pharmacist [facility] 06/29/2020, 2:48 PM

## 2020-07-02 LAB — COMPREHENSIVE METABOLIC PANEL
AG Ratio: 1.6 (calc) (ref 1.0–2.5)
ALT: 14 U/L (ref 9–46)
AST: 20 U/L (ref 10–40)
Albumin: 4.6 g/dL (ref 3.6–5.1)
Alkaline phosphatase (APISO): 76 U/L (ref 36–130)
BUN: 15 mg/dL (ref 7–25)
CO2: 30 mmol/L (ref 20–32)
Calcium: 10 mg/dL (ref 8.6–10.3)
Chloride: 103 mmol/L (ref 98–110)
Creat: 0.87 mg/dL (ref 0.60–1.35)
Globulin: 2.8 g/dL (calc) (ref 1.9–3.7)
Glucose, Bld: 79 mg/dL (ref 65–99)
Potassium: 4.9 mmol/L (ref 3.5–5.3)
Sodium: 141 mmol/L (ref 135–146)
Total Bilirubin: 0.4 mg/dL (ref 0.2–1.2)
Total Protein: 7.4 g/dL (ref 6.1–8.1)

## 2020-07-02 LAB — HEPATITIS C RNA QUANTITATIVE
HCV Quantitative Log: <1.18 log IU/mL
HCV RNA, PCR, QN: <15 IU/mL

## 2020-08-13 ENCOUNTER — Other Ambulatory Visit: Payer: Self-pay | Admitting: *Deleted

## 2020-08-13 ENCOUNTER — Encounter: Payer: Self-pay | Admitting: *Deleted

## 2020-08-14 ENCOUNTER — Ambulatory Visit: Payer: 59 | Admitting: Neurology

## 2020-08-14 ENCOUNTER — Encounter: Payer: Self-pay | Admitting: Neurology

## 2020-08-14 VITALS — BP 120/72 | HR 70 | Ht 74.0 in | Wt 153.0 lb

## 2020-08-14 DIAGNOSIS — M5432 Sciatica, left side: Secondary | ICD-10-CM | POA: Insufficient documentation

## 2020-08-14 HISTORY — DX: Sciatica, left side: M54.32

## 2020-08-14 MED ORDER — GABAPENTIN 400 MG PO CAPS
800.0000 mg | ORAL_CAPSULE | Freq: Three times a day (TID) | ORAL | 11 refills | Status: DC
Start: 2020-08-14 — End: 2021-02-09

## 2020-08-14 NOTE — Progress Notes (Signed)
Chief Complaint  Patient presents with  . Follow-up    Rm 16 with grandfather (homer) Pt states L leg down to toes are numb, some burning and tinging     HISTORICAL  Daniel Trujillo is a 40 year old male, accompanied by his grandfather, seen in request by Abner Greenspan for evaluation of left lower extremity numbness, initial evaluation was on August 14, 2020.  I reviewed and summarized the referring note. Past medical history IV heroine abuse, opiate dependent, on Suboxone Hepatitis C, was seen by infectious disease, started 8 weeks of Mavyret treatment on May 29, 2020,  Hospital admission in June 2021 for left lower extremity myositis, CT of left lower extremity demonstrate patchy hypodense appearance of the distal semimembranosus and proximal gastrocnemius muscle with inflammatory stranding without abscess or air, patient was febrile, tachycardia heart rate 120s, CBC with leukocytosis 17,300, lactic acid elevated to 3, C-reactive protein 225, procalcitonin 1.78, D-dimer 340, UDS was positive for opiates,  CT angiogram of the chest showed groundglass opacity in bilateral lungs, consistent with atypical pneumonia,  Patient was treated with broad-spectrum IV antibiotics, his left lower extremity cellulitis improved  Ultrasound of abdomen liver steatosis  During the infection in June 2021, he began to noticed significant pain at the infection sites, also dense numbness, shooting pain at left lower extremity below knee, especially left lateral leg top of his left foot, more than bottom of his left foot, over the past few months, his left foot and leg paresthesia has improved, but he still has significant numbness tingling at the left lateral leg, top of left foot, more than bottom of left foot, extending to calf area, he also noticed difficulty with left ankle dorsiflexion, relative quick improvement during the first 6 months, improvement has plateaued over the last couple months  He  was given gabapentin 400 mg 3 times a day, which provided some help  REVIEW OF SYSTEMS: Full 14 system review of systems performed and notable only for as above All other review of systems were negative.  ALLERGIES: Allergies  Allergen Reactions  . Naproxen Rash and Swelling    Other reaction(s): Joint Pain  . Carbamazepine Hives    HOME MEDICATIONS: Current Outpatient Medications  Medication Sig Dispense Refill  . buprenorphine-naloxone (SUBOXONE) 8-2 mg SUBL SL tablet Place 1 tablet under the tongue 2 (two) times daily.    . clonazePAM (KLONOPIN) 0.5 MG tablet Take 0.5 mg by mouth 2 (two) times daily.    . cyclobenzaprine (FLEXERIL) 10 MG tablet Take 10 mg by mouth at bedtime.    . gabapentin (NEURONTIN) 400 MG capsule Take 400 mg by mouth 3 (three) times daily.    . Glecaprevir-Pibrentasvir (MAVYRET) 100-40 MG TABS Take 3 tablets by mouth daily with breakfast. 84 tablet 1  . mirtazapine (REMERON) 30 MG tablet Take 30 mg by mouth at bedtime.    . ondansetron (ZOFRAN-ODT) 8 MG disintegrating tablet Take by mouth.    . pantoprazole (PROTONIX) 40 MG tablet Take 1 tablet (40 mg total) by mouth daily at 6 (six) AM. 30 tablet 1   No current facility-administered medications for this visit.    PAST MEDICAL HISTORY: Past Medical History:  Diagnosis Date  . Anxiety disorder   . Depression   . Infective myositis of left lower leg   . Insomnia   . IV drug abuse (HCC)   . Paresthesia   . Viral hepatitis     PAST SURGICAL HISTORY: History reviewed. No pertinent surgical history.  FAMILY  HISTORY: Family History  Problem Relation Age of Onset  . Hypertension Mother   . Lung disease Mother   . Stroke Maternal Grandmother   . Breast cancer Paternal Grandmother   . Lung disease Paternal Grandmother   . Colon cancer Paternal Grandfather     SOCIAL HISTORY: Social History   Socioeconomic History  . Marital status: Single    Spouse name: Not on file  . Number of children: Not  on file  . Years of education: Not on file  . Highest education level: Bachelor's degree (e.g., BA, AB, BS)  Occupational History  . Not on file  Tobacco Use  . Smoking status: Current Every Day Smoker    Packs/day: 1.00  . Smokeless tobacco: Never Used  Substance and Sexual Activity  . Alcohol use: Not Currently  . Drug use: Not Currently    Types: Heroin, Marijuana  . Sexual activity: Not on file  Other Topics Concern  . Not on file  Social History Narrative  . Not on file   Social Determinants of Health   Financial Resource Strain: Not on file  Food Insecurity: Not on file  Transportation Needs: Not on file  Physical Activity: Not on file  Stress: Not on file  Social Connections: Not on file  Intimate Partner Violence: Not on file     PHYSICAL EXAM   Vitals:   08/14/20 0914  BP: 120/72  Pulse: 70  Weight: 153 lb (69.4 kg)  Height: 6\' 2"  (1.88 m)   Not recorded     Body mass index is 19.64 kg/m.  PHYSICAL EXAMNIATION:  Gen: NAD, conversant, well nourised, well groomed                     Cardiovascular: Regular rate rhythm, no peripheral edema, warm, nontender. Eyes: Conjunctivae clear without exudates or hemorrhage Neck: Supple, no carotid bruits. Pulmonary: Clear to auscultation bilaterally   NEUROLOGICAL EXAM:  MENTAL STATUS: Speech:    Speech is normal; fluent and spontaneous with normal comprehension.  Cognition:     Orientation to time, place and person     Normal recent and remote memory     Normal Attention span and concentration     Normal Language, naming, repeating,spontaneous speech     Fund of knowledge   CRANIAL NERVES: CN II: Visual fields are full to confrontation. Pupils are round equal and briskly reactive to light. CN III, IV, VI: extraocular movement are normal. No ptosis. CN V: Facial sensation is intact to light touch CN VII: Face is symmetric with normal eye closure  CN VIII: Hearing is normal to causal conversation. CN  IX, X: Phonation is normal. CN XI: Head turning and shoulder shrug are intact  MOTOR: Bilateral upper extremity proximal and distal motor strength is normal, right lower extremity strength is normal, sensitivity to deep palpitation of left popliteal area region, especially close to left fibular head Left lower extremity: Hip flexion 5, knee flexion/extension 5, left ankle dorsiflexion 4, plantarflexion 5, inversion 5, eversion 4+  REFLEXES: Reflexes are 2+ and symmetric at the biceps, triceps, knees, at ankles right 2, left side absent. Plantar responses are flexor.  SENSORY: Decreased light touch, allodynia at left lateral leg, top of left foot, also involving bottom of left foot, extending to sural territory.  COORDINATION: There is no trunk or limb dysmetria noted.  GAIT/STANCE: He can get up from seated position arm crossed, mild left foot drop, able to walk left tiptoe, difficulty with  left heel walking   DIAGNOSTIC DATA (LABS, IMAGING, TESTING) - I reviewed patient records, labs, notes, testing and imaging myself where available.   ASSESSMENT AND PLAN  Daniel Trujillo is a 40 y.o. 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 Ph.D.  [facility] [address] [place], [place] [address] Ph: [phone] Fax: [phone]  CC:  Abner Greenspan, MD No  address on file  Jerene Canny, [place]

## 2020-08-18 ENCOUNTER — Telehealth: Payer: Self-pay | Admitting: Neurology

## 2020-08-18 NOTE — Telephone Encounter (Signed)
Noted, thank you. I sent the order to GI. They will reach out to the patient to schedule.

## 2020-08-18 NOTE — Telephone Encounter (Signed)
Please connect me.

## 2020-08-18 NOTE — Telephone Encounter (Signed)
Aprroval Number Feb 8-September 16 2020 [id number]

## 2020-08-18 NOTE — Telephone Encounter (Signed)
[facility] did not approve the MRI Tibia Fibula. To do the peer to peer the phone number is [phone]. They informed me the deadline is tomorrow 08/19/20 at 7 pm.  [facility] member ID is [id number].

## 2020-08-19 ENCOUNTER — Other Ambulatory Visit: Payer: Self-pay

## 2020-08-19 ENCOUNTER — Ambulatory Visit
Admission: RE | Admit: 2020-08-19 | Discharge: 2020-08-19 | Disposition: A | Discharge status: Home / Self Care | Payer: 59 | Source: Ambulatory Visit | Attending: Neurology | Admitting: Neurology

## 2020-08-19 DIAGNOSIS — M5432 Sciatica, left side: Secondary | ICD-10-CM

## 2020-08-19 MED ORDER — GADOBENATE DIMEGLUMINE 529 MG/ML IV SOLN
15.0000 mL | Freq: Once | INTRAVENOUS | Status: AC | PRN
  Administered 2020-08-19: 15 mL via INTRAVENOUS

## 2020-08-20 ENCOUNTER — Telehealth: Payer: Self-pay | Admitting: Neurology

## 2020-08-20 NOTE — Telephone Encounter (Signed)
Pt notified of results and verbalized understanding  

## 2020-08-20 NOTE — Telephone Encounter (Signed)
There was evidence of mild signal abnormality at tibialis anterior, likely due to denervation atrophy,  There was no etiology or local problem found to explain his left foot drop   IMPRESSION: Subtle, minimally increased T2 signal in the tibialis anterior is suggestive of early denervation atrophy or less likely myositis. Cause for this finding is not identified. Question nerve root impingement above the level of the scan including in the lumbar spine.

## 2020-10-26 NOTE — Progress Notes (Signed)
[facility]  CHIEF COMPLAINT:    Follow up for HCV  SUBJECTIVE:    Daniel Trujillo is a 40 y.o. male with PMHx as below who presents to the clinic for HCV.   Genotype: 3 Fibrosis score: F0 Pre-treatment HCV RNA: 7.7 million on 12/30/19 Hep A and B immune  He was seen on 06/29/20 by Cassie after starting Mavyret on 05/29/20 for anticipated 8 weeks of therapy.  He stopped the medication shortly after that visit and reports taking about 1/4 of the 2nd box of medications. Labs at the time of previous visit were notable for undetectable HCV RNA and normal LFTs.  He stopped meds due to nausea and vomiting and reports these symptoms have abated since that time.  He has not had a relapse of IVDU and remains clean.  Please see A&P for the details of today's visit and status of the patient's medical problems.   Patient's Medications  New Prescriptions   No medications on file  Previous Medications   BUPRENORPHINE-NALOXONE (SUBOXONE) 8-2 MG SUBL SL TABLET    Place 1 tablet under the tongue 2 (two) times daily.   CLONAZEPAM (KLONOPIN) 0.5 MG TABLET    Take 0.5 mg by mouth 2 (two) times daily.   CYCLOBENZAPRINE (FLEXERIL) 10 MG TABLET    Take 10 mg by mouth at bedtime.   GABAPENTIN (NEURONTIN) 400 MG CAPSULE    Take 2 capsules (800 mg total) by mouth 3 (three) times daily.   GLECAPREVIR-PIBRENTASVIR (MAVYRET) 100-40 MG TABS    Take 3 tablets by mouth daily with breakfast.   MIRTAZAPINE (REMERON) 30 MG TABLET    Take 30 mg by mouth at bedtime.   ONDANSETRON (ZOFRAN-ODT) 8 MG DISINTEGRATING TABLET    Take by mouth.   PANTOPRAZOLE (PROTONIX) 40 MG TABLET    Take 1 tablet (40 mg total) by mouth daily at 6 (six) AM.  Modified Medications   No medications on file  Discontinued Medications   No medications on file      Past Medical History:  Diagnosis Date  . Anxiety disorder   . Depression   . Infective myositis of left lower leg   . Insomnia   . IV drug  abuse (HCC)   . Paresthesia   . Viral hepatitis     Social History   Tobacco Use  . Smoking status: Current Every Day Smoker    Packs/day: 1.00  . Smokeless tobacco: Never Used  Substance Use Topics  . Alcohol use: Not Currently  . Drug use: Not Currently    Types: Heroin, Marijuana    Family History  Problem Relation Age of Onset  . Hypertension Mother   . Lung disease Mother   . Stroke Maternal Grandmother   . Breast cancer Paternal Grandmother   . Lung disease Paternal Grandmother   . Colon cancer Paternal Grandfather     Allergies  Allergen Reactions  . Naproxen Rash and Swelling    Other reaction(s): Joint Pain  . Carbamazepine Hives    Review of Systems  Constitutional: Negative.   Respiratory: Negative.   Cardiovascular: Negative.   Gastrointestinal: Negative.      OBJECTIVE:    Vitals:   10/27/20 1052  BP: 138/76  Pulse: 67  Temp: 97.9 F (36.6 C)  TempSrc: Oral  SpO2: 95%  Weight: 157 lb (71.2 kg)   Body mass index is 20.16 kg/m.  Physical Exam Constitutional:  General: He is not in acute distress.    Appearance: Normal appearance.  Pulmonary:     Effort: Pulmonary effort is normal. No respiratory distress.  Neurological:     General: No focal deficit present.     Mental Status: He is alert and oriented to person, place, and time.  Psychiatric:        Mood and Affect: Mood normal.        Behavior: Behavior normal.      Labs and Microbiology: CBC Latest Ref Rng & Units 04/28/2020 12/30/2019 12/29/2019  WBC 3.8 - 10.8 Thousand/uL 6.8 7.8 8.0  Hemoglobin 13.2 - 17.1 g/dL 60.4 12.4(L) 11.8(L)  Hematocrit 38.5 - 50.0 % 45.1 37.0(L) 35.3(L)  Platelets 140 - 400 Thousand/uL 187 259 235   CMP Latest Ref Rng & Units 06/29/2020 04/28/2020 04/28/2020  Glucose 65 - 99 mg/dL 79 91 -  BUN 7 - 25 mg/dL 15 13 -  Creatinine 5.40 - 1.35 mg/dL 9.81 1.91 -  Sodium 478 - 146 mmol/L 141 137 -  Potassium 3.5 - 5.3 mmol/L 4.9 4.6 -  Chloride 98  - 110 mmol/L 103 103 -  CO2 20 - 32 mmol/L 30 28 -  Calcium 8.6 - 10.3 mg/dL 29.5 62.1 -  Total Protein 6.1 - 8.1 g/dL 7.4 7.5 -  Total Bilirubin 0.2 - 1.2 mg/dL 0.4 0.4 -  Alkaline Phos 38 - 126 U/L - - -  AST 10 - 40 U/L 20 73(H) -  ALT 9 - 46 U/L 14 135(H) 134(H)     No results found for this or any previous visit (from the past 240 hour(s)).    ASSESSMENT & PLAN:    Chronic hepatitis C without hepatic coma (HCC) Will repeat his HCV RNA today to determine if still undetectable since he has been off Mavyret about 4 months after taking approximately 5-6 weeks of planned 8 week treatment course.  Follow up pending this result but if still undetectable may be considered adequately treated.  Will also d/w ID pharmacist.    Orders Placed This Encounter  Procedures  . Hepatitis C RNA quantitative        Lady Deutscher [facility] 10/27/2020, 11:11 AM

## 2020-10-27 ENCOUNTER — Encounter: Payer: Self-pay | Admitting: Internal Medicine

## 2020-10-27 ENCOUNTER — Ambulatory Visit (INDEPENDENT_AMBULATORY_CARE_PROVIDER_SITE_OTHER): Payer: 59 | Admitting: Internal Medicine

## 2020-10-27 ENCOUNTER — Other Ambulatory Visit: Payer: Self-pay

## 2020-10-27 VITALS — BP 138/76 | HR 67 | Temp 97.9°F | Wt 157.0 lb

## 2020-10-27 DIAGNOSIS — B182 Chronic viral hepatitis C: Secondary | ICD-10-CM | POA: Diagnosis not present

## 2020-10-27 NOTE — Assessment & Plan Note (Signed)
Will repeat his HCV RNA today to determine if still undetectable since he has been off Mavyret about 4 months after taking approximately 5-6 weeks of planned 8 week treatment course.  Follow up pending this result but if still undetectable may be considered adequately treated.  Will also d/w ID pharmacist.

## 2020-10-27 NOTE — Patient Instructions (Signed)
Thank you for coming to see me today. It was a pleasure seeing you.  To Do: [facility] Kitchen Labs today to determine if we need to retreat your hepatitis C.  I'll let you know about the results when I get them.   If you have any questions or concerns, please do not hesitate to call the office at [phone].  Take Care,   Gwynn Burly, DO

## 2020-10-29 LAB — HEPATITIS C RNA QUANTITATIVE
HCV Quantitative Log: <1.18 log IU/mL
HCV RNA, PCR, QN: <15 IU/mL

## 2020-11-02 ENCOUNTER — Ambulatory Visit: Payer: 59 | Admitting: Internal Medicine

## 2020-11-03 ENCOUNTER — Telehealth: Payer: Self-pay

## 2020-11-03 NOTE — Telephone Encounter (Signed)
-----   Message from Kathlynn Grate, DO sent at 11/03/2020  7:56 AM EDT ----- Please let patient know that his HCV viral load is undetectable.  Since he did not complete full treatment course, I would like him to follow up with me again in 12 weeks to repeat labs.  He will need to be scheduled as he does not currently have follow up scheduled.   Thanks, Greig Castilla

## 2020-11-03 NOTE — Telephone Encounter (Signed)
RN spoke with patient to relay that his hepatitis C viral load is undetectable per Dr. Earlene Plater. Advised patient that Dr. Earlene Plater would like to follow up with him again in 12 weeks since he was unable to complete the full course of treatment. Patient scheduled for 02/09/21, verbalized understanding and has no further questions.   Sandie Ano, RN

## 2021-02-09 ENCOUNTER — Encounter: Payer: Self-pay | Admitting: Internal Medicine

## 2021-02-09 ENCOUNTER — Ambulatory Visit (INDEPENDENT_AMBULATORY_CARE_PROVIDER_SITE_OTHER): Payer: 59 | Admitting: Internal Medicine

## 2021-02-09 ENCOUNTER — Other Ambulatory Visit: Payer: Self-pay

## 2021-02-09 VITALS — BP 130/78 | HR 63 | Temp 98.1°F | Wt 148.2 lb

## 2021-02-09 DIAGNOSIS — B182 Chronic viral hepatitis C: Secondary | ICD-10-CM

## 2021-02-09 NOTE — Progress Notes (Signed)
[facility]  CHIEF COMPLAINT:    Follow up for HCV  SUBJECTIVE:    Daniel Trujillo is a 40 y.o. male with PMHx as below who presents to the clinic for HCV.   Genotype: 3 Fibrosis score: F0 Pre-treatment HCV RNA: 7.7 million on 12/30/19 Hep A and B immune  Last seen by me on 10/27/20.  At that time he had completed Mavyret over 12 weeks prior and his HCV RNA was undetectable.  However,  because he only took 5-6 weeks of the planned 8 week course we elected to see him an additional time to repeat HCV RNA and ensure still undetectable.  He presents today with no complaints and has been free of IVDU since last visit.   Please see A&P for the details of today's visit and status of the patient's medical problems.   Patient's Medications  New Prescriptions   No medications on file  Previous Medications   BUPRENORPHINE-NALOXONE (SUBOXONE) 8-2 MG SUBL SL TABLET    Place 1 tablet under the tongue 2 (two) times daily.   CLONAZEPAM (KLONOPIN) 0.5 MG TABLET    Take 0.5 mg by mouth 2 (two) times daily.   CYCLOBENZAPRINE (FLEXERIL) 10 MG TABLET    Take 10 mg by mouth at bedtime.   DULOXETINE (CYMBALTA) 30 MG CAPSULE    Take 30 mg by mouth daily.   DULOXETINE (CYMBALTA) 60 MG CAPSULE    Take 60 mg by mouth daily.   ONDANSETRON (ZOFRAN-ODT) 8 MG DISINTEGRATING TABLET    Take by mouth.   PREGABALIN (LYRICA) 100 MG CAPSULE    Take 100 mg by mouth 3 (three) times daily.  Modified Medications   No medications on file  Discontinued Medications   GABAPENTIN (NEURONTIN) 400 MG CAPSULE    Take 2 capsules (800 mg total) by mouth 3 (three) times daily.   GLECAPREVIR-PIBRENTASVIR (MAVYRET) 100-40 MG TABS    Take 3 tablets by mouth daily with breakfast.   MIRTAZAPINE (REMERON) 30 MG TABLET    Take 30 mg by mouth at bedtime.   PANTOPRAZOLE (PROTONIX) 40 MG TABLET    Take 1 tablet (40 mg total) by mouth daily at 6 (six) AM.      Past Medical History:  Diagnosis Date    Anxiety disorder    Depression    Infective myositis of left lower leg    Insomnia    IV drug abuse (HCC)    Paresthesia    Viral hepatitis     Social History   Tobacco Use   Smoking status: Every Day    Packs/day: 1.00    Types: Cigarettes   Smokeless tobacco: Never  Substance Use Topics   Alcohol use: Not Currently   Drug use: Yes    Types: Marijuana    Family History  Problem Relation Age of Onset   Hypertension Mother    Lung disease Mother    Stroke Maternal Grandmother    Breast cancer Paternal Grandmother    Lung disease Paternal Grandmother    Colon cancer Paternal Grandfather     Allergies  Allergen Reactions   Naproxen Rash and Swelling    Other reaction(s): Joint Pain   Carbamazepine Hives    Review of Systems  Constitutional: Negative.   Respiratory: Negative.    Cardiovascular: Negative.   Gastrointestinal: Negative.     OBJECTIVE:    Vitals:   02/09/21 0836  BP: 130/78  Pulse: 63  Temp:  98.1 F (36.7 C)  TempSrc: Oral  SpO2: 99%  Weight: 148 lb 3.2 oz (67.2 kg)   Body mass index is 19.03 kg/m.  Physical Exam Constitutional:      General: He is not in acute distress.    Appearance: Normal appearance.  Pulmonary:     Effort: Pulmonary effort is normal. No respiratory distress.  Skin:    General: Skin is warm and dry.     Coloration: Skin is not jaundiced.  Neurological:     General: No focal deficit present.     Mental Status: He is alert and oriented to person, place, and time.  Psychiatric:        Mood and Affect: Mood normal.        Behavior: Behavior normal.     Labs and Microbiology: CBC Latest Ref Rng & Units 04/28/2020 12/30/2019 12/29/2019  WBC 3.8 - 10.8 Thousand/uL 6.8 7.8 8.0  Hemoglobin 13.2 - 17.1 g/dL 16.3 12.4(L) 11.8(L)  Hematocrit 38.5 - 50.0 % 45.1 37.0(L) 35.3(L)  Platelets 140 - 400 Thousand/uL 187 259 235   CMP Latest Ref Rng & Units 06/29/2020 04/28/2020 04/28/2020  Glucose 65 - 99 mg/dL 79 91 -   BUN 7 - 25 mg/dL 15 13 -  Creatinine 8.45 - 1.35 mg/dL 3.64 6.80 -  Sodium 321 - 146 mmol/L 141 137 -  Potassium 3.5 - 5.3 mmol/L 4.9 4.6 -  Chloride 98 - 110 mmol/L 103 103 -  CO2 20 - 32 mmol/L 30 28 -  Calcium 8.6 - 10.3 mg/dL 22.4 82.5 -  Total Protein 6.1 - 8.1 g/dL 7.4 7.5 -  Total Bilirubin 0.2 - 1.2 mg/dL 0.4 0.4 -  Alkaline Phos 38 - 126 U/L - - -  AST 10 - 40 U/L 20 73(H) -  ALT 9 - 46 U/L 14 135(H) 134(H)       ASSESSMENT & PLAN:    Chronic hepatitis C without hepatic coma (HCC) Repeat HCV RNA today to ensure undetectable.  If negative, can follow up PRN.  Discussed importance of continued abstinence from needles since he can become reinfected if risk factors were to be present.  He had pre-treatment fibrosis score F0 so continued liver monitoring after treatment is not necessary.   RTC PRN.    Orders Placed This Encounter  Procedures   Hepatitis C RNA quantitative        Lady Deutscher [facility] 02/09/2021, 8:51 AM

## 2021-02-09 NOTE — Patient Instructions (Signed)
Thank you for coming to see me today. It was a pleasure seeing you.  To Do: Labs today.  If your hepatitis C virus is still undetectable there is no further follow up needed I will let you know about results next week when I receive them  If you have any questions or concerns, please do not hesitate to call the office at [phone].  Take Care,   Gwynn Burly, DO

## 2021-02-09 NOTE — Assessment & Plan Note (Signed)
Repeat HCV RNA today to ensure undetectable.  If negative, can follow up PRN.  Discussed importance of continued abstinence from needles since he can become reinfected if risk factors were to be present.  He had pre-treatment fibrosis score F0 so continued liver monitoring after treatment is not necessary.   RTC PRN.

## 2021-02-11 LAB — HEPATITIS C RNA QUANTITATIVE
HCV Quantitative Log: <1.18 log IU/mL
HCV RNA, PCR, QN: <15 IU/mL

## 2021-02-12 ENCOUNTER — Telehealth: Payer: Self-pay

## 2021-02-12 NOTE — Telephone Encounter (Signed)
Results given to patient; no further questions/concerns.  Amaree Loisel Loyola Mast, RN

## 2021-02-12 NOTE — Telephone Encounter (Signed)
Left voicemail requesting patient call back to discuss lab results.   Woodie Degraffenreid Loyola Mast, RN

## 2021-02-12 NOTE — Telephone Encounter (Signed)
-----   Message from Kathlynn Grate, DO sent at 02/12/2021 11:51 AM EDT ----- Can you please let patient know that his HCV RNA was not detected and he is considered cured of Hep C.  Follow up as needed.  Thanks!

## 2021-04-15 ENCOUNTER — Ambulatory Visit: Payer: 59 | Admitting: Neurology

## 2021-04-15 ENCOUNTER — Encounter: Payer: Self-pay | Admitting: Neurology

## 2021-04-15 VITALS — BP 112/72 | HR 72 | Ht 74.0 in | Wt 143.0 lb

## 2021-04-15 DIAGNOSIS — M5432 Sciatica, left side: Secondary | ICD-10-CM | POA: Diagnosis not present

## 2021-04-15 DIAGNOSIS — M62838 Other muscle spasm: Secondary | ICD-10-CM

## 2021-04-15 HISTORY — DX: Other muscle spasm: M62.838

## 2021-04-15 NOTE — Progress Notes (Signed)
ASSESSMENT AND PLAN  Daniel Trujillo is a 40 y.o. 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  Following left lower extremity popliteal fossa region infection  MRI of left lower extremity showed subtle minimally increased T2 signal in the left tibialis anterior suggest early denervation atrophy, no significant pathology noted at left popliteal fossa  Potential mechanism of distal sciatic nerve damage including scar formation, compression, versus ischemic damage  Gabapentin 800 mg 3 times a day has been helpful, also on Cymbalta 90 mg daily Bilateral lower lower extremity muscle spasm,  MRI of lumbar spine to rule out lumbar stenosis Bilateral hands paresthesia  EMG nerve conduction study  DIAGNOSTIC DATA (LABS, IMAGING, TESTING) - I reviewed patient records, labs, notes, testing and imaging myself where available.   HISTORICAL  Daniel Trujillo is a 40 year old male, accompanied by his grandfather, seen in request by Abner Greenspan for evaluation of left lower extremity numbness, initial evaluation was on August 14, 2020.  I reviewed and summarized the referring note. Past medical history IV heroine abuse, opiate dependent, on Suboxone Hepatitis C, was seen by infectious disease, started 8 weeks of Mavyret treatment on May 29, 2020,  Hospital admission in June 2021 for left lower extremity myositis, CT of left lower extremity demonstrate patchy hypodense appearance of the distal semimembranosus and proximal gastrocnemius muscle with inflammatory stranding without abscess or air, patient was febrile, tachycardia heart rate 120s, CBC with leukocytosis 17,300, lactic acid elevated to 3, C-reactive protein 225, procalcitonin 1.78, D-dimer 340, UDS was positive for opiates,  CT angiogram of the chest showed groundglass opacity in bilateral lungs, consistent with atypical pneumonia,  Patient was treated with broad-spectrum  IV antibiotics, his left lower extremity cellulitis improved  Ultrasound of abdomen liver steatosis  During the infection of left leg close to left popliteal fossa in June 2021, he began to noticed significant pain at the infection sites, also dense numbness, shooting pain at left lower extremity below knee, especially left lateral leg top of his left foot, more than bottom of his left foot, over the past few months, his left foot and leg paresthesia has improved, but he still has significant numbness tingling at the left lateral leg, top of left foot, more than bottom of left foot, extending to calf area, he also noticed difficulty with left ankle dorsiflexion, relative quick improvement during the first 6 months, improvement has plateaued over the last couple months  He was given gabapentin 400 mg 3 times a day, which provided some help  UPDATE Oct 6 th 2022 Is overall doing better, he can ambulate better, less leg, and foot less painful less sensitive, still on Cymbalta 60+30 mg daily, and gabapentin 800 mg 3 times a day, which has helped his symptoms  He denies significant low back pain, but complains of frequent bilateral calf muscle spasm, not on the left, also involving right leg, he denies bowel and bladder incontinence, he also complains of intermittent bilateral hands paresthesia  Personally reviewed MRI of left tibia-fibula region, subtle minimally enhanced T2 signal in the tibial anterior is suggestive of early denervation atrophy,   PHYSICAL EXAM   Vitals:   04/15/21 0833  BP: 112/72  Pulse: 72  Weight: 143 lb (64.9 kg)  Height: 6\' 2"  (1.88 m)      Body mass index is 18.36 kg/m.  PHYSICAL EXAMNIATION:  Gen: NAD, conversant, well nourised, well groomed  NEUROLOGICAL EXAM:  MENTAL STATUS: Speech/cognition: Awake, alert, oriented to history taking and casual conversation   CRANIAL NERVES: CN II: Visual fields are full to confrontation. Pupils are round equal  and briskly reactive to light. CN III, IV, VI: extraocular movement are normal. No ptosis. CN V: Facial sensation is intact to light touch CN VII: Face is symmetric with normal eye closure  CN VIII: Hearing is normal to causal conversation. CN IX, X: Phonation is normal. CN XI: Head turning and shoulder shrug are intact  MOTOR: Bilateral upper extremity normal muscle tone bulk and strength  LE Hip Flexion Knee flexion Knee extension Ankle Dorsiflexion Eversion Ankle plantar Flexion Inversion  R 5 5 5 5 5 5 5   L 5 5 5 4  5- 5 5     REFLEXES: Reflexes are 2+ and symmetric at the biceps, triceps, knees, at ankles right 2, left side absent. Plantar responses are flexor.  SENSORY: Decreased light touch, allodynia at left lateral leg, top of left foot, also involving bottom of left foot, extending to sural territory.  COORDINATION: There is no trunk or limb dysmetria noted.  GAIT/STANCE: He can get up from seated position arm crossed, mild left foot drop, able to walk left tiptoe, difficulty with left heel walking  REVIEW OF SYSTEMS: Full 14 system review of systems performed and notable only for as above All other review of systems were negative.  ALLERGIES: Allergies  Allergen Reactions   Naproxen Rash and Swelling    Other reaction(s): Joint Pain   Carbamazepine Hives    HOME MEDICATIONS: Current Outpatient Medications  Medication Sig Dispense Refill   buprenorphine-naloxone (SUBOXONE) 8-2 mg SUBL SL tablet Place under the tongue 2 (two) times daily.     clonazePAM (KLONOPIN) 0.5 MG tablet Take 0.5 mg by mouth 3 (three) times daily.     cyclobenzaprine (FLEXERIL) 10 MG tablet Take 10 mg by mouth at bedtime.     DULoxetine (CYMBALTA) 30 MG capsule Take 30 mg by mouth daily.     DULoxetine (CYMBALTA) 60 MG capsule Take 60 mg by mouth daily.     gabapentin (NEURONTIN) 800 MG tablet Take 800 mg by mouth 3 (three) times daily.     ondansetron (ZOFRAN-ODT) 8 MG disintegrating  tablet Take by mouth.     No current facility-administered medications for this visit.    PAST MEDICAL HISTORY: Past Medical History:  Diagnosis Date   Anxiety disorder    Depression    Infective myositis of left lower leg    Insomnia    IV drug abuse (HCC)    Paresthesia    Viral hepatitis     PAST SURGICAL HISTORY: History reviewed. No pertinent surgical history.  FAMILY HISTORY: Family History  Problem Relation Age of Onset   Hypertension Mother    Lung disease Mother    Stroke Maternal Grandmother    Breast cancer Paternal Grandmother    Lung disease Paternal Grandmother    Colon cancer Paternal Grandfather     SOCIAL HISTORY: Social History   Socioeconomic History   Marital status: Single    Spouse name: Not on file   Number of children: Not on file   Years of education: Not on file   Highest education level: Bachelor's degree (e.g., BA, AB, BS)  Occupational History   Not on file  Tobacco Use   Smoking status: Every Day    Packs/day: 1.00    Types: Cigarettes   Smokeless tobacco: Never  Substance and Sexual  Activity   Alcohol use: Not Currently   Drug use: Yes    Types: Marijuana   Sexual activity: Not on file  Other Topics Concern   Not on file  Social History Narrative   Not on file   Social Determinants of Health   Financial Resource Strain: Not on file  Food Insecurity: Not on file  Transportation Needs: Not on file  Physical Activity: Not on file  Stress: Not on file  Social Connections: Not on file  Intimate Partner Violence: Not on file      Levert Feinstein, M.D. Ph.D.  [facility] [address] [place], [place] [address] Ph: [phone] Fax: [phone]  CC:  Abner Greenspan, MD No address on file  Jerene Canny, [place]

## 2021-04-19 ENCOUNTER — Telehealth: Payer: Self-pay | Admitting: Neurology

## 2021-04-19 NOTE — Telephone Encounter (Signed)
[employer] [employer] auth: [id number] (exp. 04/19/21 to 05/18/21) order sent to GI, they will reach out to the patient to schedule.

## 2021-04-22 ENCOUNTER — Ambulatory Visit
Admission: RE | Admit: 2021-04-22 | Discharge: 2021-04-22 | Disposition: A | Discharge status: Home / Self Care | Payer: 59 | Source: Ambulatory Visit | Attending: Neurology | Admitting: Neurology

## 2021-04-22 ENCOUNTER — Other Ambulatory Visit: Payer: Self-pay

## 2021-04-22 DIAGNOSIS — M5432 Sciatica, left side: Secondary | ICD-10-CM

## 2021-04-22 DIAGNOSIS — M62838 Other muscle spasm: Secondary | ICD-10-CM

## 2021-04-26 ENCOUNTER — Telehealth: Payer: Self-pay | Admitting: Neurology

## 2021-04-26 NOTE — Telephone Encounter (Signed)
IMPRESSION: This MRI of the lumbar spine without contrast shows the following: 1.  At L4-L5 and L5-S1, there are mild disc degenerative changes causing mild bilateral foraminal narrowing but no spinal stenosis or nerve root compression 2.  Other levels are normal or show minimal disc bulges only.  Please call patient, MRI of the lumbar spine only showed mild degenerative changes there is no evidence of spinal cord or nerve root compression

## 2021-04-26 NOTE — Telephone Encounter (Signed)
I spoke to the patient and provided him with the MRI results. He will keep his pending NCV/EMG on 06/23/21.

## 2021-06-15 ENCOUNTER — Encounter: Payer: Self-pay | Admitting: Family

## 2021-06-15 ENCOUNTER — Telehealth: Payer: Self-pay

## 2021-06-15 ENCOUNTER — Other Ambulatory Visit: Payer: Self-pay

## 2021-06-15 ENCOUNTER — Ambulatory Visit (INDEPENDENT_AMBULATORY_CARE_PROVIDER_SITE_OTHER): Payer: 59 | Admitting: Family

## 2021-06-15 VITALS — BP 122/84 | HR 67 | Temp 98.3°F | Ht 74.0 in | Wt 146.6 lb

## 2021-06-15 DIAGNOSIS — G620 Drug-induced polyneuropathy: Secondary | ICD-10-CM | POA: Insufficient documentation

## 2021-06-15 DIAGNOSIS — F411 Generalized anxiety disorder: Secondary | ICD-10-CM | POA: Diagnosis not present

## 2021-06-15 DIAGNOSIS — M5432 Sciatica, left side: Secondary | ICD-10-CM | POA: Diagnosis not present

## 2021-06-15 MED ORDER — CLONAZEPAM 0.5 MG PO TABS: 0.5000 mg | ORAL_TABLET | Freq: Three times a day (TID) | ORAL | 2 refills | Status: DC

## 2021-06-15 MED ORDER — CYCLOBENZAPRINE HCL 10 MG PO TABS: 10.0000 mg | ORAL_TABLET | Freq: Every day | ORAL | 2 refills | Status: DC

## 2021-06-15 NOTE — Assessment & Plan Note (Addendum)
Chronic - mostly left lower leg,stable with Gabapentin & Flexeril qhs, reports he is scheduled to have an updated, more extensive, nerve conduction study done soon.

## 2021-06-15 NOTE — Patient Instructions (Signed)
Welcome to [facility]! It was a pleasure meeting you today.  I have sent your refills to your pharmacy. Please schedule a 3 month follow up.  PLEASE NOTE:  If you had any LAB tests please let us know if you have not heard back within a few days. You may see your results on MyChart before we have a chance to review them but we will give you a call once they are reviewed by [place]. If we ordered any REFERRALS today, please let us know if you have not heard from their office within the next week.  Let us know through MyChart if you are needing REFILLS, or have your pharmacy send [place] the request. You can also use MyChart to communicate with me or any office staff.  Please try these tips to maintain a healthy lifestyle:  Eat most of your calories during the day when you are active. Eliminate processed foods including packaged sweets (pies, cakes, cookies), reduce intake of potatoes, white bread, white pasta, and white rice. Look for whole grain options, oat flour or almond flour.  Each meal should contain half fruits/vegetables, one quarter protein, and one quarter carbs (no bigger than a computer mouse).  Cut down on sweet beverages. This includes juice, soda, and sweet tea. Also watch fruit intake, though this is a healthier sweet option, it still contains natural sugar! Limit to 3 servings daily.  Drink at least 1 glass of water with each meal and aim for at least 8 glasses per day  Exercise at least 150 minutes every week.

## 2021-06-15 NOTE — Assessment & Plan Note (Addendum)
Chronic - stable on Klonopin tid; PDMP checked and verified. Controlled substance contract signed and scanned.

## 2021-06-15 NOTE — Telephone Encounter (Signed)
Did not see this the first time, has been corrected now, thanks.

## 2021-06-15 NOTE — Telephone Encounter (Signed)
Online it shows you last picked up 90 pills on 11/7, and I sent the same amount today with 2 refills, so not sure why they only filled 30. Did you ask them? Maybe it is a supply/stock issue.

## 2021-06-15 NOTE — Telephone Encounter (Signed)
Pharmacy is calling stating clonazepam script sent in was for 30 tablets.  States patient usually gets 90 tablets.  Is either requesting new script or call back in regard.

## 2021-06-15 NOTE — Assessment & Plan Note (Deleted)
Chronic - stable with Gabapentin & Flexeril qhs

## 2021-06-15 NOTE — Progress Notes (Signed)
Subjective:     Patient ID: Daniel Trujillo, male    DOB: 10/14/1980, 40 y.o.   MRN: [id number]  Chief Complaint  Patient presents with   Establish Care   Anxiety   Leg Pain    Sciatic nerve pain    HPI: Anxiety/Depression: Patient complains of anxiety disorder.   He has the following symptoms: difficulty concentrating, feelings of losing control, insomnia, irritable, palpitations, racing thoughts, sweating.  Onset of symptoms was approximately  years ago, He denies current suicidal and homicidal ideation.  Possible organic causes contributing are: drug abuse, neuro.  Risk factors: negative life event hx of drug abuse and previous episode of depression  Previous treatment includes benzodiazepines Klonopin  and group therapy.  He complains of the following side effects from the treatment: none. Depression screen [facility] 2/9 02/09/2021  Decreased Interest 0  Down, Depressed, Hopeless 1  PHQ - 2 Score 1   GAD 7 : Generalized Anxiety Score 06/15/2021  Nervous, Anxious, on Edge 1  Control/stop worrying 0  Worry too much - different things 1  Trouble relaxing 1  Restless 0  Easily annoyed or irritable 0  Afraid - awful might happen 0  Total GAD 7 Score 3  Anxiety Difficulty Not difficult at all  Neuropathy: He describes symptoms of numbness, burning, and tingling. Onset of symptoms was about 2 years ago and have stayed the same. Symptoms are currently of moderate severity. Symptoms occur all day and last hours. The patient denies lancinating pain, squeezing, and hypersensitivity. Symptoms are worse in the left lower extremity. He also describes autonomic symptoms of  none. Previous treatment has included Lyrica, which had improved symptoms originally, but stopped working for him. He reports Gabapentin now helps, prescribed through neurology office.    Health Maintenance Due  Topic Date Due   Pneumococcal Vaccine 90-15 Years old (1 - PCV) Never done   TETANUS/TDAP  Never done    INFLUENZA VACCINE  02/08/2021    Past Medical History:  Diagnosis Date   Infective myositis of left lower leg    Insomnia    IV drug abuse (HCC)    Paresthesia    Viral hepatitis     History reviewed. No pertinent surgical history.  Outpatient Medications Prior to Visit  Medication Sig Dispense Refill   buprenorphine-naloxone (SUBOXONE) 8-2 mg SUBL SL tablet Place under the tongue 2 (two) times daily.     DULoxetine (CYMBALTA) 30 MG capsule Take 30 mg by mouth daily.     DULoxetine (CYMBALTA) 60 MG capsule Take 60 mg by mouth daily.     gabapentin (NEURONTIN) 800 MG tablet Take 800 mg by mouth 3 (three) times daily.     clonazePAM (KLONOPIN) 0.5 MG tablet Take 0.5 mg by mouth 3 (three) times daily.     cyclobenzaprine (FLEXERIL) 10 MG tablet Take 10 mg by mouth at bedtime.     mirtazapine (REMERON) 15 MG tablet Take 15 mg by mouth daily.     ondansetron (ZOFRAN-ODT) 8 MG disintegrating tablet Take by mouth. (Patient not taking: Reported on 06/15/2021)     No facility-administered medications prior to visit.    Allergies  Allergen Reactions   Naproxen Rash and Swelling    Other reaction(s): Joint Pain   Carbamazepine Hives        Objective:    Physical Exam Vitals and nursing note reviewed.  Constitutional:      General: He is not in acute distress.    Appearance: Normal appearance.  HENT:     Head: Normocephalic.  Cardiovascular:     Rate and Rhythm: Normal rate and regular rhythm.  Pulmonary:     Effort: Pulmonary effort is normal.     Breath sounds: Normal breath sounds.  Musculoskeletal:        General: Normal range of motion.     Cervical back: Normal range of motion.     Left lower leg: Tenderness (with slight limp when walking) present.  Skin:    General: Skin is warm and dry.  Neurological:     Mental Status: He is alert and oriented to person, place, and time.  Psychiatric:        Mood and Affect: Mood normal.    BP 122/84   Pulse 67   Temp 98.3  F (36.8 C) (Temporal)   Ht 6\' 2"  (1.88 m)   Wt 146 lb 9.6 oz (66.5 kg)   SpO2 100%   BMI 18.82 kg/m  Wt Readings from Last 3 Encounters:  06/15/21 146 lb 9.6 oz (66.5 kg)  04/15/21 143 lb (64.9 kg)  02/09/21 148 lb 3.2 oz (67.2 kg)       Assessment & Plan:   Problem List Items Addressed This Visit       Nervous and Auditory   Left sciatic nerve pain - Primary    Chronic - mostly left lower leg,stable with Gabapentin & Flexeril qhs, reports he is scheduled to have an updated, more extensive, nerve conduction study done soon.      Relevant Medications   mirtazapine (REMERON) 15 MG tablet   clonazePAM (KLONOPIN) 0.5 MG tablet   cyclobenzaprine (FLEXERIL) 10 MG tablet     Other   Generalized anxiety disorder    Chronic - stable on Klonopin tid; PDMP checked and verified. Controlled substance contract signed and scanned.      Relevant Medications   mirtazapine (REMERON) 15 MG tablet   clonazePAM (KLONOPIN) 0.5 MG tablet      Meds ordered this encounter  Medications   clonazePAM (KLONOPIN) 0.5 MG tablet    Sig: Take 1 tablet (0.5 mg total) by mouth 3 (three) times daily.    Dispense:  30 tablet    Refill:  2    Order Specific Question:   Supervising Provider    Answer:   ANDY, CAMILLE L [[id number]]   cyclobenzaprine (FLEXERIL) 10 MG tablet    Sig: Take 1 tablet (10 mg total) by mouth at bedtime.    Dispense:  30 tablet    Refill:  2    Order Specific Question:   Supervising Provider    Answer:   ANDY, CAMILLE L [[id number]]

## 2021-06-17 ENCOUNTER — Other Ambulatory Visit: Payer: Self-pay | Admitting: Neurology

## 2021-06-23 ENCOUNTER — Ambulatory Visit (INDEPENDENT_AMBULATORY_CARE_PROVIDER_SITE_OTHER): Payer: 59 | Admitting: Neurology

## 2021-06-23 ENCOUNTER — Ambulatory Visit: Payer: 59 | Admitting: Neurology

## 2021-06-23 ENCOUNTER — Other Ambulatory Visit: Payer: Self-pay

## 2021-06-23 DIAGNOSIS — M5432 Sciatica, left side: Secondary | ICD-10-CM | POA: Diagnosis not present

## 2021-06-23 DIAGNOSIS — M62838 Other muscle spasm: Secondary | ICD-10-CM | POA: Insufficient documentation

## 2021-06-23 DIAGNOSIS — G5732 Lesion of lateral popliteal nerve, left lower limb: Secondary | ICD-10-CM

## 2021-06-23 DIAGNOSIS — G5742 Lesion of medial popliteal nerve, left lower limb: Secondary | ICD-10-CM | POA: Diagnosis not present

## 2021-06-23 DIAGNOSIS — G5702 Lesion of sciatic nerve, left lower limb: Secondary | ICD-10-CM | POA: Diagnosis not present

## 2021-06-23 HISTORY — DX: Lesion of lateral popliteal nerve, left lower limb: G57.32

## 2021-06-23 HISTORY — DX: Lesion of medial popliteal nerve, left lower limb: G57.42

## 2021-06-23 MED ORDER — DULOXETINE HCL 30 MG PO CPEP: 30.0000 mg | ORAL_CAPSULE | Freq: Every day | ORAL | 3 refills | Status: DC

## 2021-06-23 MED ORDER — DULOXETINE HCL 60 MG PO CPEP: 60.0000 mg | ORAL_CAPSULE | Freq: Every day | ORAL | 3 refills | Status: DC

## 2021-06-23 MED ORDER — GABAPENTIN 800 MG PO TABS: 800.0000 mg | ORAL_TABLET | Freq: Four times a day (QID) | ORAL | 11 refills | Status: DC

## 2021-06-23 NOTE — Progress Notes (Signed)
ASSESSMENT AND PLAN  Daniel Trujillo is a 40 y.o. 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   Increased gabapentin 800 mg 4 times a day has been helpful, keep Cymbalta 90 mg daily  Diclofenac and Emla gel  DIAGNOSTIC DATA (LABS, IMAGING, TESTING) - I reviewed patient records, labs, notes, testing and imaging myself where available.   HISTORICAL  Daniel Trujillo is a 40 year old male, accompanied by his grandfather, seen in request by Marco Collie for evaluation of left lower extremity numbness, initial evaluation was on August 14, 2020.  I reviewed and summarized the referring note. Past medical history IV heroine abuse, opiate dependent, on Suboxone Hepatitis C, was seen by infectious disease, started 8 weeks of [place] treatment on May 29, 2020,  Hospital admission in June 2021 for left lower extremity myositis, CT of left lower extremity demonstrate patchy hypodense appearance of the distal semimembranosus and proximal gastrocnemius muscle with inflammatory stranding without abscess or air, patient was febrile, tachycardia heart rate 120s, CBC with leukocytosis 17,300, lactic acid elevated to 3, C-reactive protein 225, procalcitonin 1.78, D-dimer 340, UDS was positive for opiates,  CT angiogram of the chest showed groundglass opacity in bilateral lungs, consistent with atypical pneumonia,  Patient was treated with broad-spectrum IV antibiotics, his left lower extremity cellulitis  improved  Ultrasound of abdomen liver steatosis  During the infection of left leg close to left popliteal fossa in June 2021, he began to noticed significant pain at the infection sites, also dense numbness, shooting pain at left lower extremity below knee, especially left lateral leg top of his left foot, more than bottom of his left foot, over the past few months, his left foot and leg paresthesia has improved, but he still has significant numbness tingling at the left lateral leg, top of left foot, more than bottom of left foot, extending to calf area, he also noticed difficulty with left ankle dorsiflexion, relative quick improvement during the first 6 months, improvement has plateaued over the last couple months  He was given gabapentin 400 mg 3 times a day, which provided some help  UPDATE Oct 6 th 2022 Is overall doing better, he can ambulate better, less leg and foot painful sensitive, still on Cymbalta 60+30 mg daily, and gabapentin 800 mg 3 times a day, which has helped his symptoms  He denies significant low back pain, but complains of frequent bilateral calf muscle spasm, mainly left, also involving right leg, he denies bowel and bladder incontinence, he also complains of intermittent bilateral hands paresthesia  Personally reviewed MRI of left tibia-fibula region, subtle minimally enhanced T2 signal in the tibial anterior is suggestive of early denervation atrophy,  UPDATE Jun 23 2021: His symptoms overall has improved, with higher dose of Cymbalta and gabapentin, but still has deep achy pain at the left lateral ankle, extending to left leg, ball of left foot, mild gait abnormality  Return to EMG nerve conduction study today, which confirmed the distal sciatic nerve damage involving left deep, superficial peroneal nerve, distal left tibial nerve.  We also  personally reviewed MRI of lumbar spine, mild degenerative changes, no significant canal or foraminal narrowing  PHYSICAL EXAM    Vitals:   04/15/21 0833  BP: 112/72  Pulse: 72  Weight: 143 lb (64.9 kg)  Height: 6\' 2"  (1.88 m)      Body mass index is 18.36 kg/m.  PHYSICAL EXAMNIATION:  Gen: NAD, conversant, well nourised, well groomed        NEUROLOGICAL EXAM:  MENTAL STATUS: Speech/cognition: Awake, alert, oriented to history taking and casual conversation   CRANIAL NERVES: CN II: Visual fields are full to confrontation. Pupils are round equal and briskly reactive to light. CN III, IV, VI: extraocular movement are normal. No ptosis. CN V: Facial sensation is intact to light touch CN VII: Face is symmetric with normal eye closure  CN VIII: Hearing is normal to causal conversation. CN IX, X: Phonation is normal. CN XI: Head turning and shoulder shrug are intact  MOTOR: Bilateral upper extremity normal muscle tone bulk and strength  LE Hip Flexion Knee flexion Knee extension Ankle Dorsiflexion Eversion Ankle plantar Flexion Inversion  R 5 5 5 5 5 5 5   L 5 5 5 4  5- 5 5     REFLEXES: Reflexes are 2+ and symmetric at the biceps, triceps, knees, at ankles right 2, left side 1. Plantar responses are flexor.  SENSORY: Decreased light touch, allodynia at left lateral leg, top of left foot, also involving bottom of left foot, extending to sural territory.  COORDINATION: There is no trunk or limb dysmetria noted.  GAIT/STANCE: He can get up from seated position arm crossed, slight left foot drop, mild difficulty with left heel and tiptoe walking  REVIEW OF SYSTEMS: Full 14 system review of systems performed and notable only for as above All other review of systems were negative.  ALLERGIES: Allergies  Allergen Reactions   Naproxen Rash and Swelling    Other reaction(s): Joint Pain   Carbamazepine Hives    HOME MEDICATIONS: Current Outpatient Medications  Medication Sig Dispense Refill   buprenorphine-naloxone (SUBOXONE) 8-2 mg SUBL SL tablet Place under the tongue 2 (two) times daily.      clonazePAM (KLONOPIN) 0.5 MG tablet Take 1 tablet (0.5 mg total) by mouth 3 (three) times daily. 90 tablet 2   cyclobenzaprine (FLEXERIL) 10 MG tablet Take 1 tablet (10 mg total) by mouth at bedtime. 30 tablet 2   DULoxetine (CYMBALTA) 30 MG capsule Take 30 mg by mouth daily.     DULoxetine (CYMBALTA) 60 MG capsule Take 60 mg by mouth daily.     gabapentin (NEURONTIN) 400 MG capsule TAKE 2 CAPSULES BY MOUTH 3 TIMES DAILY. 180 capsule 0   gabapentin (NEURONTIN) 800 MG tablet Take 800 mg by mouth 3 (three) times daily.     mirtazapine (REMERON) 15 MG tablet Take 15 mg by mouth daily.     No current facility-administered medications for this visit.    PAST MEDICAL HISTORY: Past Medical History:  Diagnosis Date   Infective myositis of left lower leg    Insomnia    IV drug abuse ([place])    Paresthesia    Viral hepatitis     PAST SURGICAL HISTORY: No past surgical history on file.  FAMILY HISTORY: Family History  Problem Relation Age of Onset   Hypertension Mother    Lung disease Mother    Stroke Maternal Grandmother    Breast cancer Paternal Grandmother    Lung disease Paternal Grandmother    Colon cancer Paternal  Grandfather     SOCIAL HISTORY: Social History   Socioeconomic History   Marital status: Single    Spouse name: Not on file   Number of children: Not on file   Years of education: Not on file   Highest education level: Bachelor's degree (e.g., BA, AB, BS)  Occupational History   Not on file  Tobacco Use   Smoking status: Every Day    Packs/day: 1.00    Types: Cigarettes   Smokeless tobacco: Never  Substance and Sexual Activity   Alcohol use: Not Currently   Drug use: Yes    Types: Marijuana   Sexual activity: Not on file  Other Topics Concern   Not on file  Social History Narrative   Not on file   Social Determinants of Health   Financial Resource Strain: Not on file  Food Insecurity: Not on file  Transportation Needs: Not on file  Physical  Activity: Not on file  Stress: Not on file  Social Connections: Not on file  Intimate Partner Violence: Not on file      Levert Feinstein, M.D. Ph.D.  [facility] [address] [place], [place] [address] Ph: [phone] Fax: [phone]  CC:  Abner Greenspan, MD No address on file  Jerene Canny, [place]

## 2021-06-23 NOTE — Procedures (Signed)
Full Name: Daniel Trujillo Gender: Male MRN #: [id number] Date of Birth: Oct 22, 1980    Visit Date: 06/23/2021 07:55 Age: 40 Years Examining Physician: Levert Feinstein, MD  Referring Physician: Levert Feinstein, MD Height: 6 feet 2 inch Patient History: 146lbs History: 40 year old male, presented with persistent left lower extremity neuropathic pain following left popliteal regions soft tissue infection, with residual left foot drop, weakness  Summary of the test  Nerve conduction study:  Left superficial peroneal sensory response was absent.  Left sural sensory response showed moderately decreased snap amplitude.  Right superficial peroneal, sural sensory responses were within normal limit.  Right median, ulnar, and mixed responses were normal.  Left tibial motor responses showed significantly decreased CMAP amplitude, with slowed conduction velocity.  Left peroneal to EDB motor response was absent.  Right peroneal to EDB, tibial, median and ulnar motor responses were normal.  Electromyography:  Selected needle examination of left, and right lower extremity muscles were performed  There was evidence of mild active neuropathic changes involving left tibialis anterior, peroneal longus; left tibialis posterior, left medial gastrocnemius.   Conclusion: This is an abnormal study.  There is evidence of mild left tibial, left superficial, and deep peroneal neuropathy.  There is no evidence of left upper extremity neuropathy, or right lower extremity neuropathy.    ------------------------------- Levert Feinstein M.D. Ph.D.  [facility] [address] [place], [place] [address] Tel: [phone] Fax: [phone]  Verbal informed consent was obtained from the patient, patient was informed of potential risk of procedure, including bruising, bleeding, hematoma formation, infection, muscle weakness, muscle pain, numbness, among others.        MNC    Nerve /  Sites Muscle Latency Ref. Amplitude Ref. Rel Amp Segments Distance Velocity Ref. Area    ms ms mV mV %  cm m/s m/s mVms  R Median - APB     Wrist APB 3.3 ?4.4 10.8 ?4.0 100 Wrist - APB 7   40.9     Upper arm APB 8.2  9.3  86.5 Upper arm - Wrist 24 49 ?49 37.8  R Ulnar - ADM     Wrist ADM 3.3 ?3.3 9.4 ?6.0 100 Wrist - ADM 7   39.8     B.Elbow ADM 7.5  9.5  101 B.Elbow - Wrist 21 50 ?49 42.3     A.Elbow ADM 9.5  8.9  93.2 A.Elbow - B.Elbow 10 49 ?49 40.8  R Peroneal - EDB     Ankle EDB 4.1 ?6.5 5.0 ?2.0 100 Ankle - EDB 9   17.4     Fib head EDB 10.8  4.0  79.9 Fib head - Ankle 30 45 ?44 14.2     Pop fossa EDB 12.9  5.0  124 Pop fossa - Fib head 10 46 ?44 18.8         Pop fossa - Ankle      L Peroneal - EDB     Ankle EDB NR ?6.5 NR ?2.0 NR Ankle - EDB 9   NR     Fib head EDB NR  NR  NR Fib head - Ankle 30 NR ?44 NR     Pop fossa EDB NR  NR  NR Pop fossa - Fib head 10 NR ?44 NR         Pop fossa - Ankle      R Tibial - AH     Ankle AH 3.5 ?5.8 7.1 ?4.0 100  Ankle - AH 9   23.5     Pop fossa AH 13.5  5.1  71.4 Pop fossa - Ankle 42 42 ?41 20.1  L Tibial - AH     Ankle AH 6.2 ?5.8 1.5 ?4.0 100 Ankle - AH 9   5.9     Pop fossa AH 21.2  0.3  17.2 Pop fossa - Ankle 40 27 ?41 1.9                    SNC    Nerve / Sites Rec. Site Peak Lat Ref.  Amp Ref. Segments Distance Peak Diff Ref.    ms ms V V  cm ms ms  R Sural - Ankle (Calf)     Calf Ankle 4.3 ?4.4 13 ?6 Calf - Ankle 14    L Sural - Ankle (Calf)     Calf Ankle 4.4 ?4.4 3 ?6 Calf - Ankle 14    R Superficial peroneal - Ankle     Lat leg Ankle 4.2 ?4.4 6 ?6 Lat leg - Ankle 14    L Superficial peroneal - Ankle     Lat leg Ankle NR ?4.4 NR ?6 Lat leg - Ankle 14    R Median, Ulnar - Transcarpal comparison     Median Palm Wrist 2.3 ?2.2 55 ?35 Median Palm - Wrist 8       Ulnar Palm Wrist 2.3 ?2.2 14 ?12 Ulnar Palm - Wrist 8          Median Palm - Ulnar Palm  0.0 ?0.4  R Median - Orthodromic (Dig II, Mid palm)     Dig II Wrist 3.4  ?3.4 14 ?10 Dig II - Wrist 13    R Ulnar - Orthodromic, (Dig V, Mid palm)     Dig V Wrist 3.1 ?3.1 8 ?5 Dig V - Wrist 37                     F  Wave    Nerve F Lat Ref.   ms ms  R Ulnar - ADM 31.6 ?32.0  R Tibial - AH 58.2 ?56.0  L Tibial - AH 67.0 ?56.0           EMG Summary Table    Spontaneous MUAP Recruitment  Muscle IA Fib PSW Fasc Other Amp Dur. Poly Pattern  L. Tibialis anterior Increased None None None _______ Increased Increased 1+ Reduced  L. Tibialis posterior Increased None None None _______ Increased Increased Normal Reduced  L. Peroneus longus Increased None None None _______ Increased Increased 1+ Reduced  L. Gastrocnemius (Medial head) Increased None None None _______ Normal Normal Normal Reduced  L. Biceps Femoris (long head) Normal None  None None ______ Normal  Normal Normal Norma  L. Biceps Femoris (short head) Normal Nonel None None _______ Normal Normal Normal Normal  L. Vastus lateralis Normal None None None _______ Normal Normal Normal Normal  R. Tibialis anterior Normal None None None _______ Normal Normal Normal Normal  R. Tibialis posterior Normal None None None _______ Normal Normal Normal Normal  R. Peroneus longus Normal None None None _______ Normal Normal Normal Normal

## 2021-09-13 ENCOUNTER — Encounter: Payer: Self-pay | Admitting: Family

## 2021-09-13 ENCOUNTER — Other Ambulatory Visit: Payer: Self-pay

## 2021-09-13 ENCOUNTER — Ambulatory Visit (INDEPENDENT_AMBULATORY_CARE_PROVIDER_SITE_OTHER): Payer: 59 | Admitting: Family

## 2021-09-13 VITALS — BP 120/74 | HR 65 | Temp 98.2°F | Ht 74.0 in | Wt 145.0 lb

## 2021-09-13 DIAGNOSIS — Z23 Encounter for immunization: Secondary | ICD-10-CM | POA: Diagnosis not present

## 2021-09-13 DIAGNOSIS — F411 Generalized anxiety disorder: Secondary | ICD-10-CM | POA: Diagnosis not present

## 2021-09-13 DIAGNOSIS — G5742 Lesion of medial popliteal nerve, left lower limb: Secondary | ICD-10-CM

## 2021-09-13 MED ORDER — CLONAZEPAM 0.5 MG PO TABS: 0.5000 mg | ORAL_TABLET | Freq: Three times a day (TID) | ORAL | 2 refills | Status: DC

## 2021-09-13 MED ORDER — CYCLOBENZAPRINE HCL 10 MG PO TABS: 10.0000 mg | ORAL_TABLET | Freq: Every day | ORAL | 2 refills | Status: DC

## 2021-09-13 NOTE — Progress Notes (Signed)
? ?Subjective:  ? ? ? Patient ID: Daniel Trujillo, male    DOB: 1980-12-20, 41 y.o.   MRN: [id number] ? ?Chief Complaint  ?Patient presents with  ? Anxiety  ?  Pt states his anxiety has been good.   ? ? ?HPI: ?Anxiety/Depression: Patient complains of anxiety disorder.   ?He has the following symptoms: difficulty concentrating, feelings of losing control, insomnia, irritable, palpitations, racing thoughts, sweating.  ?Onset of symptoms was approximately  years ago, He denies current suicidal and homicidal ideation.  ?Possible organic causes contributing are: drug abuse, neuro.  ?Risk factors: negative life event hx of drug abuse and previous episode of depression  ?Previous treatment includes benzodiazepines Klonopin and group therapy.  He complains of the following side effects from the treatment: none. ?Neuropathy: He describes symptoms of numbness, burning, and tingling. Onset of symptoms was about 2 years ago and have stayed the same. Symptoms are currently of moderate severity. Symptoms occur all day and last hours. The patient denies lancinating pain, squeezing, and hypersensitivity. Symptoms are worse in the left lower extremity. He also describes autonomic symptoms of  none. Previous treatment has included Lyrica, which had improved symptoms originally, but stopped working for him. He reports Gabapentin now helps, prescribed through neurology office.  ? ?Health Maintenance Due  ?Topic Date Due  ? COVID-19 Vaccine (3 - Booster for Pfizer series) 01/04/2020  ? ? ?Past Medical History:  ?Diagnosis Date  ? Infective myositis of left lower leg   ? Insomnia   ? IV drug abuse (HCC)   ? Paresthesia   ? Viral hepatitis   ? ? ?History reviewed. No pertinent surgical history. ? ?Outpatient Medications Prior to Visit  ?Medication Sig Dispense Refill  ? buprenorphine-naloxone (SUBOXONE) 8-2 mg SUBL SL tablet Place under the tongue 2 (two) times daily.    ? DULoxetine (CYMBALTA) 30 MG capsule Take 1 capsule (30 mg total)  by mouth daily. 90 capsule 3  ? DULoxetine (CYMBALTA) 60 MG capsule Take 1 capsule (60 mg total) by mouth daily. 90 capsule 3  ? gabapentin (NEURONTIN) 800 MG tablet Take 1 tablet (800 mg total) by mouth 4 (four) times daily. 120 tablet 11  ? mirtazapine (REMERON) 15 MG tablet Take 15 mg by mouth daily.    ? clonazePAM (KLONOPIN) 0.5 MG tablet Take 1 tablet (0.5 mg total) by mouth 3 (three) times daily. 90 tablet 2  ? cyclobenzaprine (FLEXERIL) 10 MG tablet Take 1 tablet (10 mg total) by mouth at bedtime. 30 tablet 2  ? ?No facility-administered medications prior to visit.  ? ? ?Allergies  ?Allergen Reactions  ? Naproxen Rash and Swelling  ?  Other reaction(s): Joint Pain  ? Carbamazepine Hives  ? ? ? ?   ?Objective:  ?  ?Physical Exam ?Vitals and nursing note reviewed.  ?Constitutional:   ?   General: He is not in acute distress. ?   Appearance: Normal appearance.  ?HENT:  ?   Head: Normocephalic.  ?Cardiovascular:  ?   Rate and Rhythm: Normal rate and regular rhythm.  ?Pulmonary:  ?   Effort: Pulmonary effort is normal.  ?   Breath sounds: Normal breath sounds.  ?Musculoskeletal:     ?   General: Normal range of motion.  ?   Cervical back: Normal range of motion.  ?Skin: ?   General: Skin is warm and dry.  ?Neurological:  ?   Mental Status: He is alert and oriented to person, place, and time.  ?Psychiatric:     ?  Mood and Affect: Mood normal.  ? ? ?BP 120/74 (BP Location: Left Arm, Patient Position: Sitting, Cuff Size: Normal)   Pulse 65   Temp 98.2 ?F (36.8 ?C) (Temporal)   Ht 6\' 2"  (1.88 m)   Wt 145 lb (65.8 kg)   SpO2 100%   BMI 18.62 kg/m?  ?Wt Readings from Last 3 Encounters:  ?09/13/21 145 lb (65.8 kg)  ?06/15/21 146 lb 9.6 oz (66.5 kg)  ?04/15/21 143 lb (64.9 kg)  ? ? ?   ?Assessment & Plan:  ? ?Problem List Items Addressed This Visit   ? ?  ? Nervous and Auditory  ? Left tibial neuropathy  ?  Chronic - pt having continued stiffness, pain, had PT when his injury first occurred, but could not do  much, now he has more mobility and less pain he feels he would get more out of therapy, he is requesting PT from [facility] in [place]. ?  ?  ? Relevant Medications  ? cyclobenzaprine (FLEXERIL) 10 MG tablet  ? clonazePAM (KLONOPIN) 0.5 MG tablet  ? Other Relevant Orders  ? Ambulatory referral to Physical Therapy  ?  ? Other  ? Generalized anxiety disorder  ?  Chronic - stable on Klonopin ?  ?  ? Relevant Medications  ? clonazePAM (KLONOPIN) 0.5 MG tablet  ? ?Other Visit Diagnoses   ? ? Need for prophylactic vaccination with combined diphtheria-tetanus-pertussis (DTP) vaccine    -  Primary  ? Relevant Orders  ? Tdap vaccine greater than or equal to 7yo IM (Completed)  ? ?  ? ? ? ?

## 2021-09-13 NOTE — Assessment & Plan Note (Signed)
Chronic - pt having continued stiffness, pain, had PT when his injury first occurred, but could not do much, now he has more mobility and less pain he feels he would get more out of therapy, he is requesting PT from [facility] in [place]. ?

## 2021-09-13 NOTE — Assessment & Plan Note (Signed)
Chronic stable on Klonopin

## 2021-10-01 IMAGING — MR MR [PERSON_NAME] LOW WO/W CM*L*
5 of 9 series · 25 of 40 positions shown · IV contrast (multihance)
Comparison: None.

CLINICAL DATA: Left foot drop and loss of muscle mass in the left
lower leg. Question soft tissue mass.

EXAM:
MRI OF LOWER LEFT EXTREMITY WITHOUT AND WITH CONTRAST
TECHNIQUE: Multiplanar, multisequence MR imaging of the left lower leg was
performed both before and after administration of intravenous
contrast.
CONTRAST:  15 mL MULTIHANCE GADOBENATE DIMEGLUMINE 529 MG/ML IV SOLN

[Series 7: T1 · coronal · left · 4.0mm · 1.04mm/px · 3 of 24 slices shown (1 of 2)]
[im 1/24]
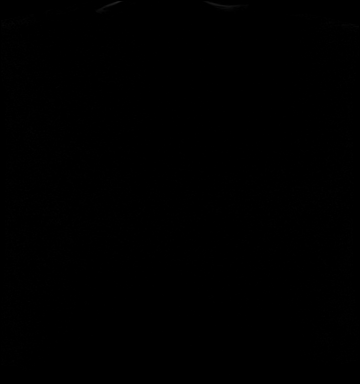
[im 12/24]
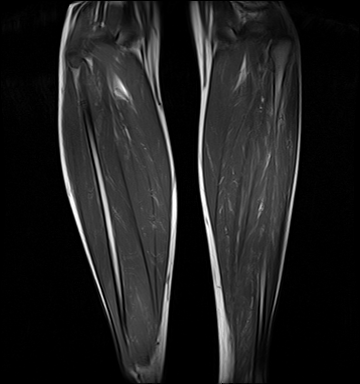
[im 24/24]
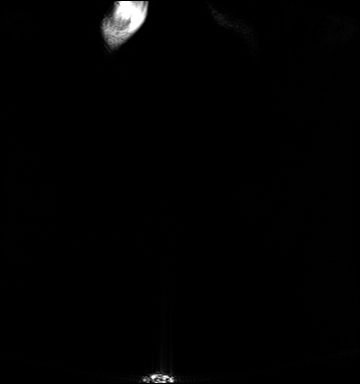

[Series 9: T1 · axial · left · 4.0mm · 0.43mm/px · z∈[-198,+162]mm · 7 of 73 slices shown (2 of 2)]
[im 1/73]
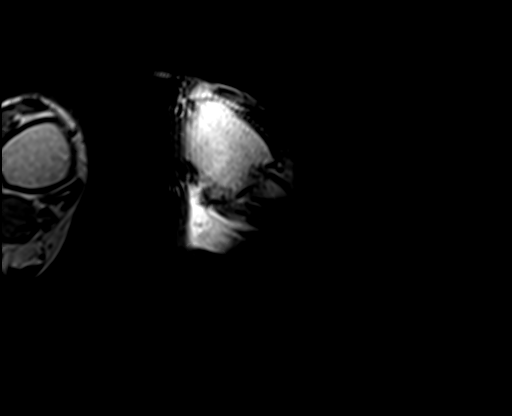
[im 13/73]
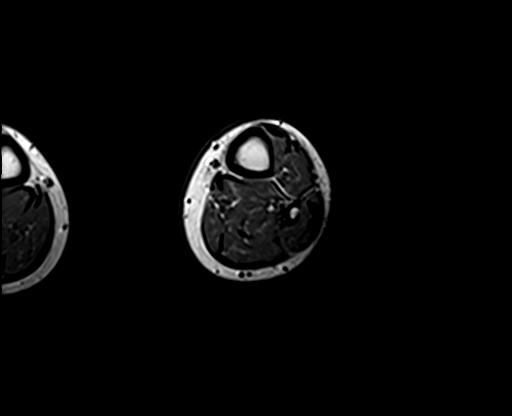
[im 25/73]
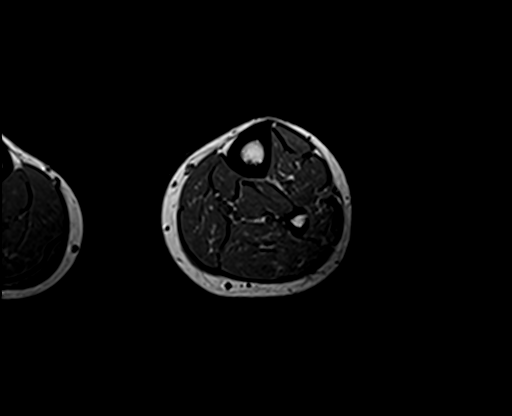
[im 37/73]
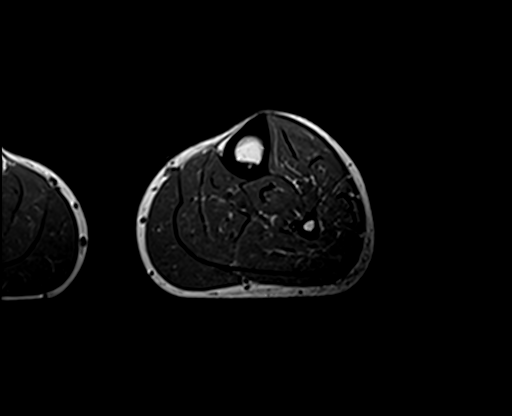
[im 49/73]
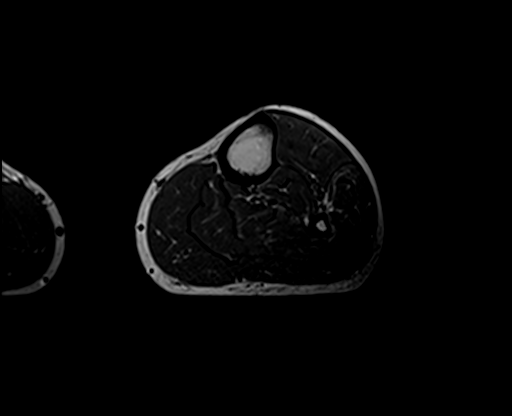
[im 61/73]
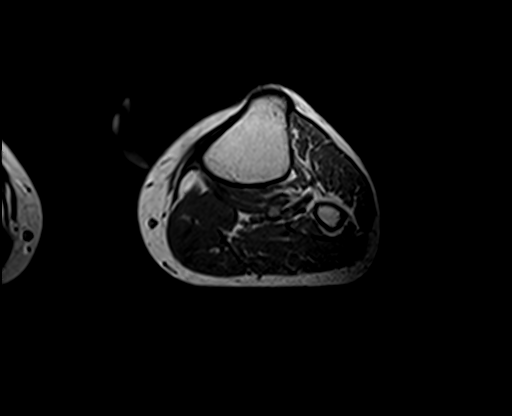
[im 73/73]
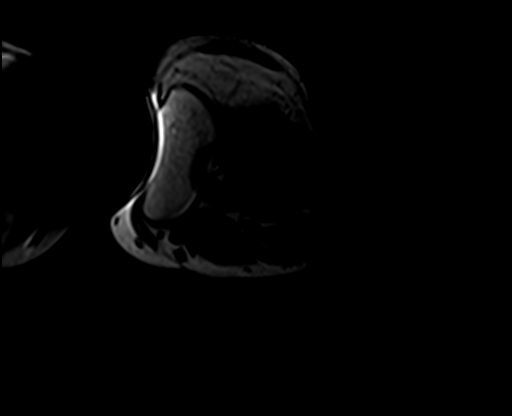

[Series 10: T2 fat-sat · axial · left · 4.0mm · 0.43mm/px · z∈[-198,+162]mm · 7 of 73 slices shown]
[im 1/73]
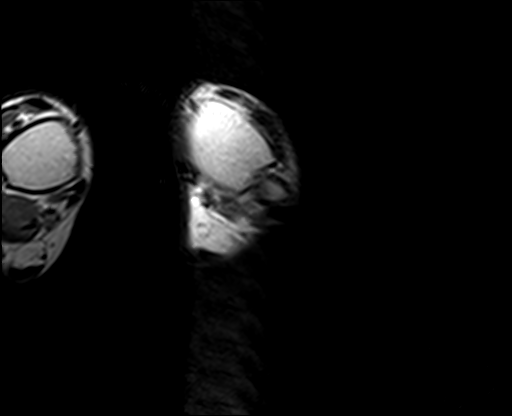
[im 13/73]
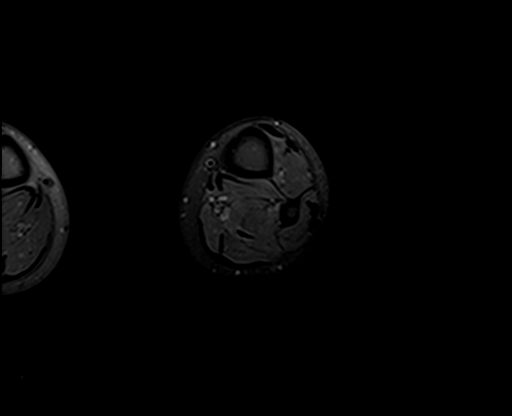
[im 25/73]
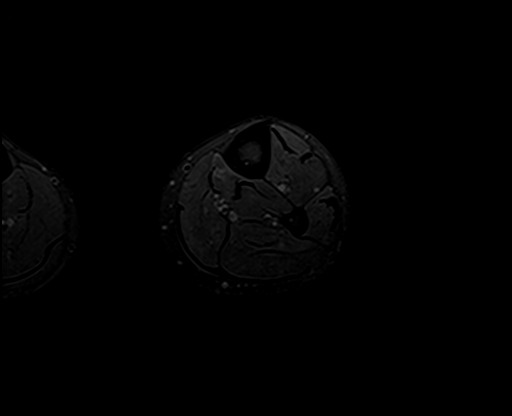
[im 37/73]
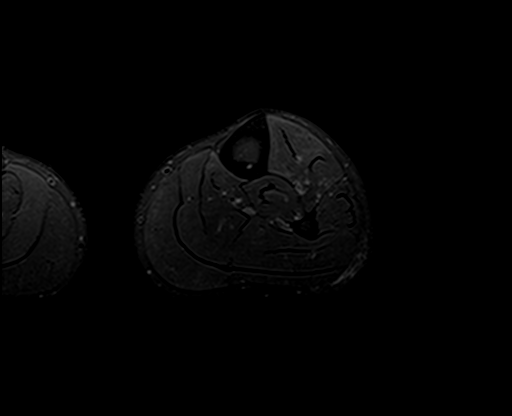
[im 49/73]
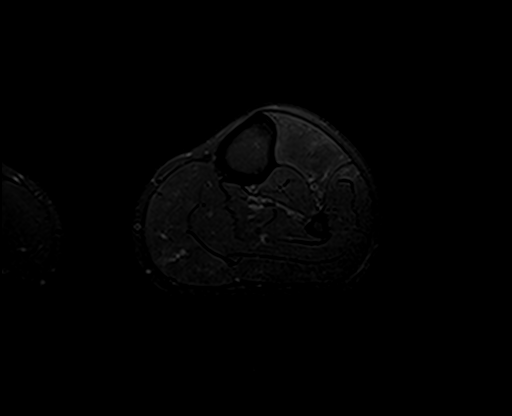
[im 61/73]
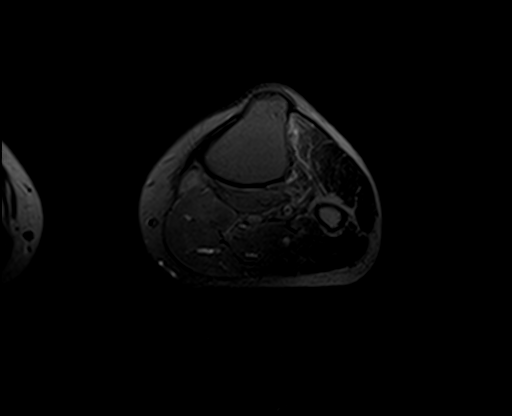
[im 73/73]
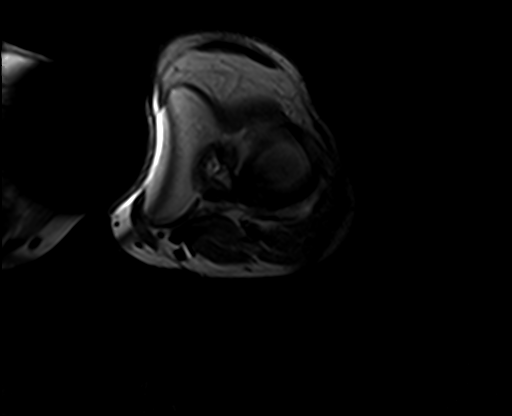

[Series 12: T1 fat-sat · axial · left · 4.0mm · 0.43mm/px · z∈[-198,+162]mm · 7 of 73 slices shown (1 of 2)]
[im 1/73]
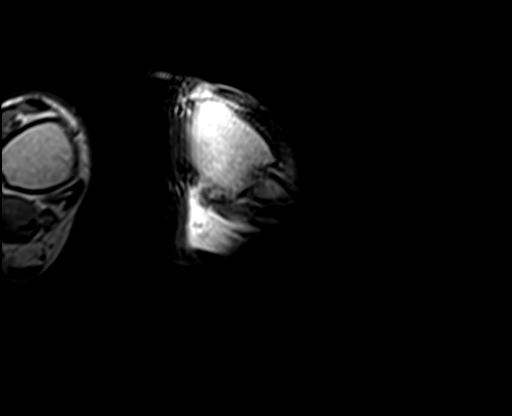
[im 13/73]
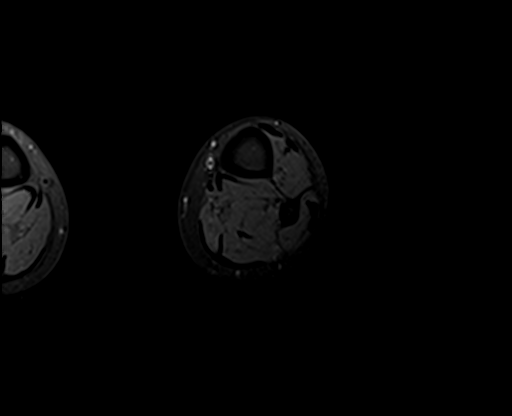
[im 25/73]
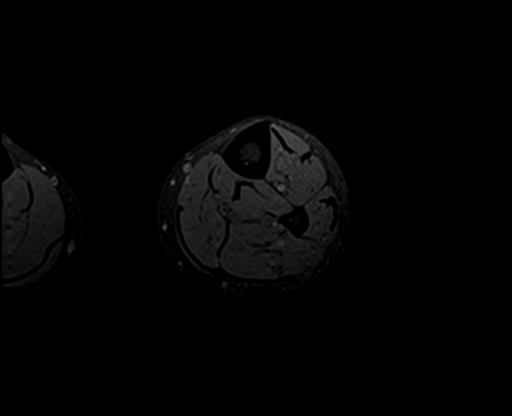
[im 37/73]
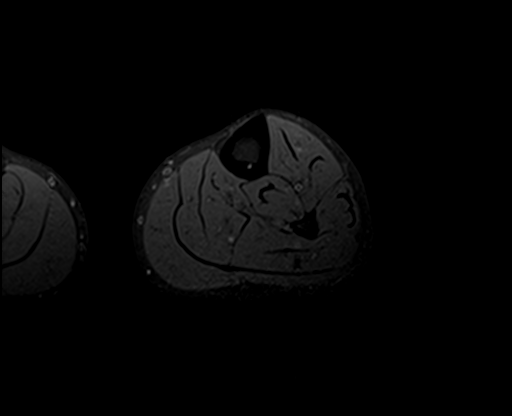
[im 49/73]
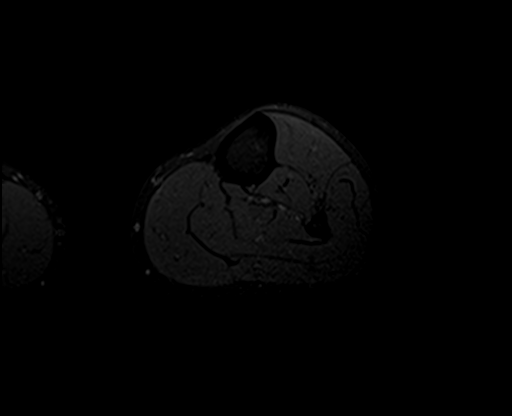
[im 61/73]
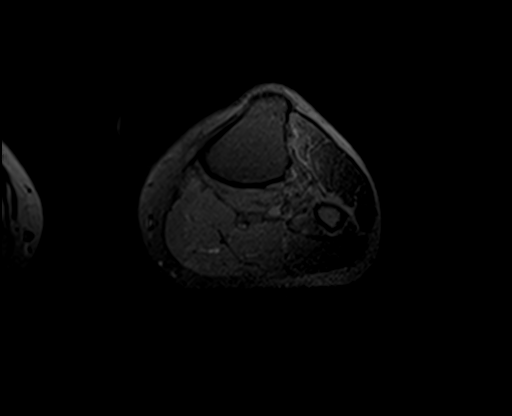
[im 73/73]
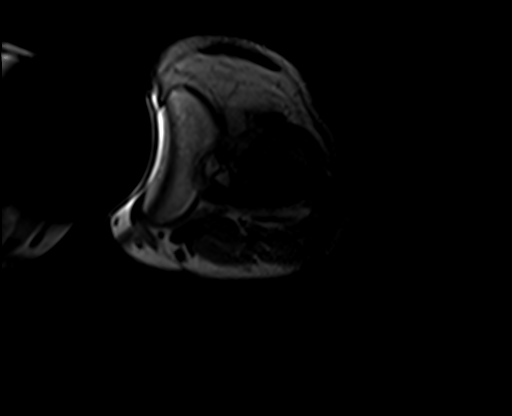

[Series 13: T1 fat-sat · axial · left · 4.0mm · 0.43mm/px · 1 of 73 slices shown (2 of 2)]
[im 1/73]
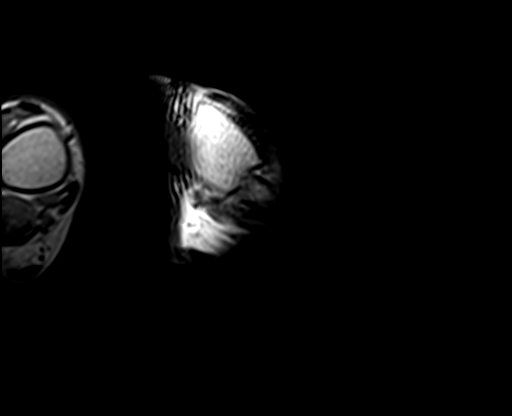

[25 of 40 positions shown; findings below may reference images not displayed]

FINDINGS: Bones/Joint/Cartilage

Marrow signal is normal throughout without fracture, stress change
or focal lesion.

Ligaments

Negative.

Muscles and Tendons

There is subtle, mildly increased T2 signal in the tibialis anterior
muscle belly. There is no fatty atrophy of muscle. No tendon tear or
strain. No intramuscular mass or fluid collection.

Soft tissues

Negative.  No fluid collection or mass.
IMPRESSION: Subtle, minimally increased T2 signal in the tibialis anterior is
suggestive of early denervation atrophy or less likely myositis.
Cause for this finding is not identified. Question nerve root
impingement above the level of the scan including in the lumbar
spine.

## 2021-11-21 ENCOUNTER — Other Ambulatory Visit: Payer: Self-pay | Admitting: Family

## 2021-11-21 DIAGNOSIS — G5742 Lesion of medial popliteal nerve, left lower limb: Secondary | ICD-10-CM

## 2021-12-03 ENCOUNTER — Other Ambulatory Visit: Payer: Self-pay | Admitting: Family

## 2021-12-03 DIAGNOSIS — F411 Generalized anxiety disorder: Secondary | ICD-10-CM

## 2021-12-10 ENCOUNTER — Encounter: Payer: Self-pay | Admitting: Family

## 2021-12-10 ENCOUNTER — Telehealth (INDEPENDENT_AMBULATORY_CARE_PROVIDER_SITE_OTHER): Payer: 59 | Admitting: Family

## 2021-12-10 DIAGNOSIS — G5732 Lesion of lateral popliteal nerve, left lower limb: Secondary | ICD-10-CM | POA: Diagnosis not present

## 2021-12-10 DIAGNOSIS — F411 Generalized anxiety disorder: Secondary | ICD-10-CM

## 2021-12-10 MED ORDER — DULOXETINE HCL 30 MG PO CPEP: 30.0000 mg | ORAL_CAPSULE | Freq: Every day | ORAL | 0 refills | Status: DC

## 2021-12-10 MED ORDER — CYCLOBENZAPRINE HCL 10 MG PO TABS: ORAL_TABLET | ORAL | 0 refills | Status: DC

## 2021-12-10 MED ORDER — DULOXETINE HCL 60 MG PO CPEP: 60.0000 mg | ORAL_CAPSULE | Freq: Every day | ORAL | 0 refills | Status: DC

## 2021-12-10 MED ORDER — CLONAZEPAM 0.5 MG PO TABS: 0.5000 mg | ORAL_TABLET | Freq: Three times a day (TID) | ORAL | 2 refills | Status: DC

## 2021-12-10 NOTE — Assessment & Plan Note (Addendum)
Chronic - stable on Flexeril & gabapentin, sending flexeril refill today, f/u in 3 mos.

## 2021-12-10 NOTE — Progress Notes (Signed)
MyChart Video Visit    Virtual Visit via Video Note   This visit type was conducted due to national recommendations for restrictions regarding the COVID-19 Pandemic (e.g. social distancing) in an effort to limit this patient's exposure and mitigate transmission in our community. This patient is at least at moderate risk for complications without adequate follow up. This format is felt to be most appropriate for this patient at this time. Physical exam was limited by quality of the video and audio technology used for the visit. CMA was able to get the patient set up on a video visit.  Patient location: Home. Patient and provider in visit Provider location: Office  I discussed the limitations of evaluation and management by telemedicine and the availability of in person appointments. The patient expressed understanding and agreed to proceed.  Visit Date: 12/10/2021  Today's healthcare provider: Dulce Sellar, NP     Subjective:    Patient ID: Daniel Trujillo, male    DOB: [birth year], 41 y.o.   MRN: [id number]  Chief Complaint  Patient presents with   Anxiety    Pt states his anxiety have been up and down.   Follow-up    Nerve damage. Pt states it is still the same.     HPI Anxiety/Depression: Patient complains of anxiety disorder.   He has the following symptoms: difficulty concentrating, feelings of losing control, insomnia, irritable, palpitations, racing thoughts, sweating.  Onset of symptoms was approximately  years ago, He denies current suicidal and homicidal ideation.  Possible organic causes contributing are: drug abuse, neuro.  Risk factors: negative life event hx of drug abuse and previous episode of depression  Previous treatment includes benzodiazepines Klonopin, Cymbalta, and group therapy.  He complains of the following side effects from the treatment: none. Neuropathy: He describes symptoms of numbness, burning, and tingling. Onset of symptoms was about 2  years ago and have stayed the same. Symptoms are currently of moderate severity. Symptoms occur all day and last hours. The patient denies lancinating pain, squeezing, and hypersensitivity. Symptoms are worse in the left lower extremity. He also describes autonomic symptoms of  none. Previous treatment has included Lyrica, which had improved symptoms originally, but stopped working for him. He reports Gabapentin now helps, prescribed through neurology office & Flexeril.  Assessment & Plan:   Problem List Items Addressed This Visit       Nervous and Auditory   Deep peroneal neuropathy, left    Chronic - stable on Flexeril & gabapentin, sending flexeril refill today, f/u in 3 mos.       Relevant Medications   clonazePAM (KLONOPIN) 0.5 MG tablet   cyclobenzaprine (FLEXERIL) 10 MG tablet   DULoxetine (CYMBALTA) 60 MG capsule   DULoxetine (CYMBALTA) 30 MG capsule                       Other   Generalized anxiety disorder    Chronic - pt stable on Klonopin and Cymbalta, sending refills today. f/u in 3 mos.       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  Outpatient Medications Prior to Visit  Medication Sig Dispense Refill   buprenorphine-naloxone (SUBOXONE) 8-2 mg SUBL SL tablet Place under the tongue 2 (two) times daily.     gabapentin (NEURONTIN) 800 MG tablet Take 1 tablet (800 mg total) by mouth 4 (four) times daily. 120 tablet 11   clonazePAM (KLONOPIN) 0.5 MG tablet Take 1 tablet (0.5 mg total) by mouth 3 (three) times daily. 90 tablet 2   cyclobenzaprine (FLEXERIL) 10 MG tablet TAKE 1 TABLET BY MOUTH EVERYDAY AT BEDTIME 30 tablet 2   DULoxetine  (CYMBALTA) 30 MG capsule Take 1 capsule (30 mg total) by mouth daily. 90 capsule 3   DULoxetine (CYMBALTA) 60 MG capsule Take 1 capsule (60 mg total) by mouth daily. 90 capsule 3   mirtazapine (REMERON) 15 MG tablet Take 15 mg by mouth daily.     No facility-administered medications prior to visit.    Allergies  Allergen Reactions   Naproxen Rash and Swelling    Other reaction(s): Joint Pain   Carbamazepine Hives       Objective:     Physical Exam Vitals and nursing note reviewed.  Constitutional:      General: She is not in acute distress.    Appearance: Normal appearance.  HENT:     Head: Normocephalic.  Pulmonary:     Effort: No respiratory distress.  Musculoskeletal:     Cervical back: Normal range of motion.  Skin:    General: Skin is dry.     Coloration: Skin is not pale.  Neurological:     Mental Status: She is alert and oriented to person, place, and time.  Psychiatric:        Mood and Affect: Mood normal.   Ht 6\' 2"  (1.88 m)   Wt 145 lb (65.8 kg)   BMI 18.62 kg/m   Wt Readings from Last 3 Encounters:  12/10/21 145 lb (65.8 kg)  09/13/21 145 lb (65.8 kg)  06/15/21 146 lb 9.6 oz (66.5 kg)   I discussed the assessment and treatment plan with the patient. The patient was provided an opportunity to ask questions and all were answered. The patient agreed with the plan and demonstrated an understanding of the instructions.   The patient was advised to call back or seek an in-person evaluation if the symptoms worsen or if the condition fails to improve as anticipated.  I provided 22 minutes of face-to-face time during this encounter.  14/06/22, NP [facility] [place] [phone] (phone) [phone] (fax)  [facility]

## 2021-12-10 NOTE — Assessment & Plan Note (Addendum)
Chronic - pt stable on Klonopin and Cymbalta, sending refills today. f/u in 3 mos.

## 2021-12-14 ENCOUNTER — Telehealth: Payer: 59 | Admitting: Family

## 2022-02-02 ENCOUNTER — Telehealth: Payer: Self-pay | Admitting: Family

## 2022-02-02 DIAGNOSIS — F411 Generalized anxiety disorder: Secondary | ICD-10-CM

## 2022-02-02 MED ORDER — CLONAZEPAM 0.5 MG PO TABS: 0.5000 mg | ORAL_TABLET | Freq: Three times a day (TID) | ORAL | 1 refills | Status: DC

## 2022-02-02 NOTE — Telephone Encounter (Signed)
RX sent

## 2022-02-02 NOTE — Telephone Encounter (Signed)
Patient states that [facility] [address] in [place], [place], but since they didn't have it in stock, the [facility] [address] in [place], [place] where he was able to pick it up.  -Currently he states he tried to have rx order transferred back to [facility] once they informed him that they had his medication in stock at that location, but was told he needed a new Rx order.   -Patient requests a new Rx order with his regular 2 refills be sent back to [facility] location. HE DOES NOT NEED ANY MORE MEDICATION AT THIS TIME.

## 2022-02-16 ENCOUNTER — Other Ambulatory Visit: Payer: Self-pay | Admitting: Family

## 2022-02-16 ENCOUNTER — Telehealth: Payer: Self-pay | Admitting: Family

## 2022-02-16 DIAGNOSIS — G5732 Lesion of lateral popliteal nerve, left lower limb: Secondary | ICD-10-CM

## 2022-02-16 NOTE — Telephone Encounter (Signed)
Patient states he is going out of the [facility] 02/20/22 and requests RX for clonazePAM (KLONOPIN) 0.5 MG tablet  be overridden so that Patient can pick up his medication [place] to leaving the [place] due to Pharmacy won't let Patient get refill early (only allows 3 days early).  Patient requests to be called at ph# [phone] for status of above request.  [facility] is: [id number] [place], [place] - [address] Phone:  [phone]  Fax:  [phone]

## 2022-02-19 NOTE — Telephone Encounter (Signed)
Sorry, not possible with a controlled substance.

## 2022-02-21 NOTE — Telephone Encounter (Signed)
I called pt and Pt gave a verbalized understanding.

## 2022-03-15 ENCOUNTER — Other Ambulatory Visit: Payer: Self-pay | Admitting: Family

## 2022-03-15 DIAGNOSIS — G5732 Lesion of lateral popliteal nerve, left lower limb: Secondary | ICD-10-CM

## 2022-04-04 ENCOUNTER — Encounter: Payer: Self-pay | Admitting: *Deleted

## 2022-04-07 ENCOUNTER — Telehealth (INDEPENDENT_AMBULATORY_CARE_PROVIDER_SITE_OTHER): Payer: Commercial Managed Care - HMO | Admitting: Family

## 2022-04-07 ENCOUNTER — Encounter (INDEPENDENT_AMBULATORY_CARE_PROVIDER_SITE_OTHER): Payer: Self-pay

## 2022-04-07 ENCOUNTER — Encounter: Payer: Self-pay | Admitting: Family

## 2022-04-07 DIAGNOSIS — G5732 Lesion of lateral popliteal nerve, left lower limb: Secondary | ICD-10-CM | POA: Diagnosis not present

## 2022-04-07 DIAGNOSIS — F411 Generalized anxiety disorder: Secondary | ICD-10-CM

## 2022-04-07 MED ORDER — CLONAZEPAM 0.5 MG PO TABS: 0.5000 mg | ORAL_TABLET | Freq: Three times a day (TID) | ORAL | 1 refills | Status: DC

## 2022-04-07 MED ORDER — CYCLOBENZAPRINE HCL 10 MG PO TABS: 10.0000 mg | ORAL_TABLET | Freq: Every day | ORAL | 2 refills | Status: DC

## 2022-04-07 NOTE — Assessment & Plan Note (Signed)
   chronic   stable on Klonopin & Cymbalta  refilling Klonopin  f/u in 3 mos in office

## 2022-04-07 NOTE — Assessment & Plan Note (Signed)
   chronic  stopped cymbalta, was not helping  taking Gabapentin thru Neuro  refilling Flexeril today  f/u in 3 mos

## 2022-04-07 NOTE — Progress Notes (Signed)
MyChart Video Visit    Virtual Visit via Video Note   This format is felt to be most appropriate for this patient at this time. Physical exam was limited by quality of the video and audio technology used for the visit. CMA was able to get the patient set up on a video visit.  Patient location: Home. Patient and provider in visit Provider location: Office  I discussed the limitations of evaluation and management by telemedicine and the availability of in person appointments. The patient expressed understanding and agreed to proceed.  Visit Date: 04/07/2022  Today's healthcare provider: Jeanie Sewer, NP     Subjective:   Patient ID: Daniel Trujillo, male    DOB: 1980/08/10, 41 y.o.   MRN: [id number]  Chief Complaint  Patient presents with   Anxiety    Follow up    HPI Anxiety/Depression: Patient complains of anxiety disorder.   He has the following symptoms: difficulty concentrating, feelings of losing control, insomnia, irritable, palpitations, racing thoughts, sweating.  Onset of symptoms was approximately  years ago, He denies current suicidal and homicidal ideation.  Possible organic causes contributing are: drug abuse, neuro.  Risk factors: negative life event hx of drug abuse and previous episode of depression  Previous treatment includes benzodiazepines Klonopin, Cymbalta, and group therapy.  He complains of the following side effects from the treatment: none. Neuropathy: He describes symptoms of numbness, burning, and tingling. Onset of symptoms was about 2 years ago and have stayed the same. Symptoms are currently of moderate severity. Symptoms occur all day and last hours. The patient denies lancinating pain, squeezing, and hypersensitivity. Symptoms are worse in the left lower extremity. He also describes autonomic symptoms of  none. Previous treatment has included Lyrica, which had improved symptoms originally, but stopped working for him. He reports  Gabapentin now helps, prescribed through neurology office & Flexeril.   Assessment & Plan:   Problem List Items Addressed This Visit       Nervous and Auditory   Deep peroneal neuropathy, left    chronic stopped cymbalta, was not helping taking Gabapentin thru Neuro refilling Flexeril today f/u in 3 mos      Relevant Medications   clonazePAM (KLONOPIN) 0.5 MG tablet   cyclobenzaprine (FLEXERIL) 10 MG tablet     Other   Generalized anxiety disorder    chronic  stable on Klonopin & Cymbalta refilling Klonopin f/u in 3 mos in office      Relevant Medications   clonazePAM (KLONOPIN) 0.5 MG tablet    Past Medical History:  Diagnosis Date   Elevated liver transaminase level 12/25/2019   Infective myositis of left lower leg    Insomnia    IV drug abuse (HCC)    Left sciatic nerve pain 08/14/2020   Followed by [facility], failed Lyrica, result of extensive IV drug use injected in left leg   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  Outpatient Medications Prior to Visit  Medication Sig Dispense Refill   buprenorphine-naloxone (SUBOXONE) 8-2 mg SUBL SL tablet Place under the tongue 2 (two) times daily.     clonazePAM (KLONOPIN) 0.5 MG tablet Take 1 tablet (0.5 mg total) by mouth 3 (three) times daily. 90 tablet 1   cyclobenzaprine (FLEXERIL) 10 MG tablet TAKE 1 TABLET BY MOUTH EVERYDAY AT BEDTIME 30 tablet 1   gabapentin (NEURONTIN) 800 MG tablet Take  1 tablet (800 mg total) by mouth 4 (four) times daily. (Patient not taking: Reported on 04/07/2022) 120 tablet 11   DULoxetine (CYMBALTA) 30 MG capsule Take 1 capsule (30 mg total) by mouth daily. (Patient not taking: Reported on 04/07/2022) 90 capsule 0   DULoxetine (CYMBALTA) 60 MG capsule Take 1 capsule (60 mg total) by mouth daily. (Patient not taking: Reported on 04/07/2022) 90 capsule 0   No facility-administered medications prior to  visit.    Allergies  Allergen Reactions   Naproxen Rash and Swelling    Other reaction(s): Joint Pain   Carbamazepine Hives       Objective:   Physical Exam Vitals and nursing note reviewed.  Constitutional:      General: She is not in acute distress.    Appearance: Normal appearance.  HENT:     Head: Normocephalic.  Pulmonary:     Effort: No respiratory distress.  Musculoskeletal:     Cervical back: Normal range of motion.  Skin:    General: Skin is dry.     Coloration: Skin is not pale.  Neurological:     Mental Status: She is alert and oriented to person, place, and time.  Psychiatric:        Mood and Affect: Mood normal.   Ht 6\' 2"  (1.88 m)   Wt 150 lb (68 kg)   BMI 19.26 kg/m   Wt Readings from Last 3 Encounters:  04/07/22 150 lb (68 kg)  12/10/21 145 lb (65.8 kg)  09/13/21 145 lb (65.8 kg)     I discussed the assessment and treatment plan with the patient. The patient was provided an opportunity to ask questions and all were answered. The patient agreed with the plan and demonstrated an understanding of the instructions.   The patient was advised to call back or seek an in-person evaluation if the symptoms worsen or if the condition fails to improve as anticipated.  11/13/21, NP [facility] [place] [phone] (phone) [phone] (fax)  [facility]

## 2022-04-08 ENCOUNTER — Telehealth: Payer: 59 | Admitting: Family

## 2022-05-18 ENCOUNTER — Telehealth: Payer: Self-pay | Admitting: Family

## 2022-05-18 DIAGNOSIS — F411 Generalized anxiety disorder: Secondary | ICD-10-CM

## 2022-05-18 NOTE — Telephone Encounter (Signed)
LAST APPOINTMENT DATE:  Please schedule appointment if longer than 1 year  04/07/22  NEXT APPOINTMENT DATE: 07/07/22  MEDICATION: clonazePAM (KLONOPIN) 0.5 MG tablet   Is the patient out of medication? No  [facility]ACY:  [id number] [place], [place] - [address] Phone: [phone]  Fax: [phone]      Let patient know to contact pharmacy at the end of the day to make sure medication is ready.  Please notify patient to allow 48-72 hours to process

## 2022-05-22 MED ORDER — CLONAZEPAM 0.5 MG PO TABS: 0.5000 mg | ORAL_TABLET | Freq: Three times a day (TID) | ORAL | 0 refills | Status: DC

## 2022-05-22 NOTE — Addendum Note (Signed)
Addended byDulce Sellar on: 05/22/2022 08:12 PM   Modules accepted: Orders

## 2022-05-22 NOTE — Telephone Encounter (Signed)
RX sent

## 2022-06-21 ENCOUNTER — Other Ambulatory Visit: Payer: Self-pay

## 2022-06-21 MED ORDER — GABAPENTIN 800 MG PO TABS: 800.0000 mg | ORAL_TABLET | Freq: Four times a day (QID) | ORAL | 1 refills | Status: DC

## 2022-06-23 ENCOUNTER — Encounter: Payer: Self-pay | Admitting: *Deleted

## 2022-07-07 ENCOUNTER — Other Ambulatory Visit: Payer: Self-pay | Admitting: Neurology

## 2022-07-07 ENCOUNTER — Ambulatory Visit (INDEPENDENT_AMBULATORY_CARE_PROVIDER_SITE_OTHER): Payer: Medicaid Other | Admitting: Family

## 2022-07-07 ENCOUNTER — Encounter: Payer: Self-pay | Admitting: Family

## 2022-07-07 VITALS — BP 118/62 | HR 108 | Temp 97.8°F | Ht 74.0 in | Wt 161.2 lb

## 2022-07-07 DIAGNOSIS — Z23 Encounter for immunization: Secondary | ICD-10-CM | POA: Diagnosis not present

## 2022-07-07 DIAGNOSIS — F1111 Opioid abuse, in remission: Secondary | ICD-10-CM | POA: Insufficient documentation

## 2022-07-07 DIAGNOSIS — F411 Generalized anxiety disorder: Secondary | ICD-10-CM | POA: Diagnosis not present

## 2022-07-07 DIAGNOSIS — G5732 Lesion of lateral popliteal nerve, left lower limb: Secondary | ICD-10-CM

## 2022-07-07 MED ORDER — CLONAZEPAM 0.5 MG PO TABS: 0.5000 mg | ORAL_TABLET | Freq: Three times a day (TID) | ORAL | 2 refills | Status: DC

## 2022-07-07 MED ORDER — CYCLOBENZAPRINE HCL 10 MG PO TABS: 10.0000 mg | ORAL_TABLET | Freq: Every day | ORAL | 2 refills | Status: AC

## 2022-07-07 NOTE — Assessment & Plan Note (Signed)
chronic stable on Klonopin sending refill f/u in 3 mos virtual

## 2022-07-07 NOTE — Assessment & Plan Note (Signed)
chronic taking Gabapentin 3200mg qd thru [facility] refilling Flexeril today f/u in 3 mos 

## 2022-07-07 NOTE — Progress Notes (Signed)
Patient ID: Daniel Trujillo, male    DOB: August 25, 1980, 41 y.o.   MRN: [id number]  Chief Complaint  Patient presents with   Neurologic Problem    Pt stated that he is still having some pain dw the Lt leg and has not gotten any better.    HPI: Anxiety/Depression: Patient complains of anxiety disorder.   He has the following symptoms: difficulty concentrating, feelings of losing control, insomnia, irritable, palpitations, racing thoughts, sweating. Onset of symptoms was approximately  years ago, He denies current suicidal and homicidal ideation. Possible organic causes contributing are: drug abuse, neuro. Risk factors: negative life event hx of drug abuse and previous episode of depression Previous treatment includes benzodiazepines Klonopin, Cymbalta, and group therapy.  He complains of the following side effects from the treatment: none. Neuropathy: He describes symptoms of numbness, burning, and tingling. Onset of symptoms was about 3 years ago and have stayed the same. Symptoms are currently of moderate severity. Symptoms occur all day and last hours. The patient denies lancinating pain, squeezing, and hypersensitivity. Symptoms are worse in the left lower extremity. He also describes autonomic symptoms of  none. Previous treatment has included Lyrica, which had improved symptoms originally, but stopped working for him. He reports Gabapentin now helps, prescribed through neurology office & Flexeril.  Assessment & Plan:   Problem List Items Addressed This Visit       Nervous and Auditory   Deep peroneal neuropathy, left    chronic taking Gabapentin 3200mg  qd thru [facility] refilling Flexeril today f/u in 3 mos      Relevant Medications   clonazePAM (KLONOPIN) 0.5 MG tablet   cyclobenzaprine (FLEXERIL) 10 MG tablet     Other   Generalized anxiety disorder - Primary    chronic stable on Klonopin sending refill f/u in 3 mos virtual      Relevant Medications   clonazePAM (KLONOPIN)  0.5 MG tablet   Other Visit Diagnoses     Need for immunization against influenza       Relevant Orders   Flu Vaccine QUAD 65mo+IM (Fluarix, Fluzone & Alfiuria Quad PF) (Completed)      Subjective:    Outpatient Medications Prior to Visit  Medication Sig Dispense Refill   buprenorphine-naloxone (SUBOXONE) 8-2 mg SUBL SL tablet Place under the tongue 2 (two) times daily.     gabapentin (NEURONTIN) 800 MG tablet Take 1 tablet (800 mg total) by mouth 4 (four) times daily. 120 tablet 1   clonazePAM (KLONOPIN) 0.5 MG tablet Take 1 tablet (0.5 mg total) by mouth 3 (three) times daily. 90 tablet 0   cyclobenzaprine (FLEXERIL) 10 MG tablet Take 1 tablet (10 mg total) by mouth at bedtime. 30 tablet 2   No facility-administered medications prior to visit.   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 No pertinent surgical history. Allergies  Allergen Reactions   Naproxen Rash and Swelling    Other reaction(s): Joint Pain   Carbamazepine Hives  Objective:    Physical Exam Vitals and nursing note reviewed.  Constitutional:      General: He is not in acute distress.    Appearance: Normal appearance.  HENT:     Head: Normocephalic.  Cardiovascular:     Rate and Rhythm: Normal rate and regular rhythm.  Pulmonary:     Effort: Pulmonary effort is normal.     Breath sounds: Normal breath sounds.  Musculoskeletal:        General: Normal range of motion.     Cervical back: Normal range of motion.  Skin:    General: Skin is  warm and dry.  Neurological:     Mental Status: He is alert and oriented to person, place, and time.     Motor: Weakness (left leg) present.  Psychiatric:        Mood and Affect: Mood normal.    BP 118/62   Pulse (!) 108   Temp 97.8 F (36.6 C)   Ht 6\' 2"  (1.88 m)   Wt 161 lb 3.2 oz (73.1 kg)   SpO2 96%   BMI 20.70 kg/m  Wt Readings from Last 3 Encounters:  07/07/22 161 lb 3.2 oz (73.1 kg)  04/07/22 150 lb (68 kg)  12/10/21 145 lb (65.8 kg)       02/09/22, NP

## 2022-07-26 ENCOUNTER — Other Ambulatory Visit: Payer: Self-pay | Admitting: Neurology

## 2022-08-01 ENCOUNTER — Ambulatory Visit: Payer: Commercial Managed Care - HMO | Admitting: Neurology

## 2022-08-01 ENCOUNTER — Encounter: Payer: Self-pay | Admitting: Neurology

## 2022-08-30 ENCOUNTER — Ambulatory Visit: Payer: Commercial Managed Care - HMO | Admitting: Neurology

## 2022-08-30 ENCOUNTER — Other Ambulatory Visit: Payer: Self-pay | Admitting: Neurology

## 2022-10-03 ENCOUNTER — Other Ambulatory Visit: Payer: Self-pay | Admitting: Family

## 2022-10-03 ENCOUNTER — Encounter: Payer: Self-pay | Admitting: Family

## 2022-10-03 ENCOUNTER — Telehealth (INDEPENDENT_AMBULATORY_CARE_PROVIDER_SITE_OTHER): Payer: Medicaid Other | Admitting: Family

## 2022-10-03 VITALS — Ht 74.0 in | Wt 160.0 lb

## 2022-10-03 DIAGNOSIS — F411 Generalized anxiety disorder: Secondary | ICD-10-CM

## 2022-10-03 DIAGNOSIS — G5732 Lesion of lateral popliteal nerve, left lower limb: Secondary | ICD-10-CM | POA: Diagnosis not present

## 2022-10-03 MED ORDER — BUSPIRONE HCL 5 MG PO TABS: 5.0000 mg | ORAL_TABLET | Freq: Two times a day (BID) | ORAL | 2 refills | Status: DC

## 2022-10-03 NOTE — Addendum Note (Signed)
Addended byJeanie Sewer on: 10/03/2022 08:29 AM   Modules accepted: Level of Service

## 2022-10-03 NOTE — Progress Notes (Signed)
MyChart Video Visit    Virtual Visit via Video Note   This format is felt to be most appropriate for this patient at this time. Physical exam was limited by quality of the video and audio technology used for the visit. CMA was able to get the patient set up on a video visit.  Patient location: Home. Patient and provider in visit Provider location: Office  I discussed the limitations of evaluation and management by telemedicine and the availability of in person appointments. The patient expressed understanding and agreed to proceed.  Visit Date: 10/03/2022  Today's healthcare provider: Jeanie Sewer, NP     Subjective:   Patient ID: Daniel Trujillo, male    DOB: Aug 03, 1980, 42 y.o.   MRN: [id number]  Chief Complaint  Patient presents with   Anxiety    3 month f/u    HPI Anxiety/Depression: Patient complains of anxiety disorder.   He has the following symptoms: difficulty concentrating, feelings of losing control, insomnia, irritable, palpitations, racing thoughts, sweating. Onset of symptoms was approximately  years ago, He denies current suicidal and homicidal ideation. Possible organic causes contributing are: drug abuse, neuro. Risk factors: negative life event hx of drug abuse and previous episode of depression Previous treatment includes benzodiazepines Klonopin, Cymbalta, and group therapy.  He complains of the following side effects from the treatment: not as effective, asking to increase to 2mg  qd. Neuropathy: He describes symptoms of numbness, burning, and tingling. Onset of symptoms was about 3 years ago and have stayed the same. Symptoms are currently of moderate severity. Symptoms occur all day and last hours. The patient denies lancinating pain, squeezing, and hypersensitivity. Symptoms are worse in the left lower extremity. He also describes autonomic symptoms of  none. Previous treatment has included Lyrica, which had improved symptoms originally, but stopped  working for him. He reports Gabapentin now helps, prescribed through neurology office & Flexeril.   Assessment & Plan:  Generalized anxiety disorder Assessment & Plan: chronic stable on Klonopin 0.5mg  tid, requesting 1mg  bid advised on adding Buspar or Vistaril, do not want to increase Benzo reports he thinks he was on Buspar in past and caused "brain zaps" sending refill f/u in 3 mos virtual  Orders: -     busPIRone HCl; Take 1 tablet (5 mg total) by mouth 2 (two) times daily.  Dispense: 60 tablet; Refill: 2  Deep peroneal neuropathy, left Assessment & Plan: chronic taking Gabapentin 3200mg  qd thru Neuro refilling Flexeril today f/u in 3 mos    Past Medical History:  Diagnosis Date   Elevated liver transaminase level 12/25/2019   Infective myositis of left lower leg    Insomnia    IV drug abuse ([place])    Left sciatic nerve pain 08/14/2020   Followed by [facility]URO, failed Lyrica, result of extensive IV drug use injected in left leg   Left tibial neuropathy 06/23/2021   Muscle spasm 04/15/2021   Neuropathy of left superficial peroneal nerve 06/23/2021   Opiate dependence ([place]) 12/25/2019   Paresthesia    Viral hepatitis     No past surgical history on file.  Outpatient Medications Prior to Visit  Medication Sig Dispense Refill   buprenorphine-naloxone (SUBOXONE) 8-2 mg SUBL SL tablet Place under the tongue 2 (two) times daily.     clonazePAM (KLONOPIN) 0.5 MG tablet Take 1 tablet (0.5 mg total) by mouth 3 (three) times daily. 90 tablet 2   cyclobenzaprine (FLEXERIL) 10 MG tablet Take 1 tablet (10 mg total) by mouth  at bedtime. 30 tablet 2   gabapentin (NEURONTIN) 800 MG tablet TAKE 1 TABLET BY MOUTH 4 TIMES DAILY. 120 tablet 3   No facility-administered medications prior to visit.    Allergies  Allergen Reactions   Naproxen Rash and Swelling    Other reaction(s): Joint Pain   Carbamazepine Hives      Objective:   Physical Exam Vitals and nursing note reviewed.   Constitutional:      General: Pt is not in acute distress.    Appearance: Normal appearance.  HENT:     Head: Normocephalic.  Pulmonary:     Effort: No respiratory distress.  Musculoskeletal:     Cervical back: Normal range of motion.  Skin:    General: Skin is dry.     Coloration: Skin is not pale.  Neurological:     Mental Status: Pt is alert and oriented to person, place, and time.  Psychiatric:        Mood and Affect: Mood normal.   Ht 6\' 2"  (1.88 m)   Wt 160 lb (72.6 kg)   BMI 20.54 kg/m   Wt Readings from Last 3 Encounters:  10/03/22 160 lb (72.6 kg)  07/07/22 161 lb 3.2 oz (73.1 kg)  04/07/22 150 lb (68 kg)      I discussed the assessment and treatment plan with the patient. The patient was provided an opportunity to ask questions and all were answered. The patient agreed with the plan and demonstrated an understanding of the instructions.   The patient was advised to call back or seek an in-person evaluation if the symptoms worsen or if the condition fails to improve as anticipated.  Jeanie Sewer, NP [place] [phone] (phone) [phone] (fax)  [place]

## 2022-10-03 NOTE — Assessment & Plan Note (Addendum)
chronic stable on Klonopin 0.5mg  tid, requesting 1mg  bid advised on adding Buspar or Vistaril, do not want to increase Benzo reports he thinks he was on Buspar in past and caused "brain zaps" sending refill f/u in 3 mos virtual

## 2022-10-03 NOTE — Assessment & Plan Note (Signed)
chronic taking Gabapentin 3200mg  qd thru Neuro refilling Flexeril today f/u in 3 mos

## 2022-10-04 ENCOUNTER — Telehealth: Payer: Self-pay | Admitting: Family

## 2022-10-04 NOTE — Telephone Encounter (Signed)
Per patient he was seen by provider yesterday . Pharmacy received prescription for Buspirone but has not received prescription for Clonazepam . Please advise . Patient call back number is [phone] . Preferred pharmacy is [id number] - [place], [place]

## 2022-10-04 NOTE — Telephone Encounter (Signed)
RX now sent ?

## 2022-10-05 ENCOUNTER — Other Ambulatory Visit: Payer: Self-pay | Admitting: Family

## 2022-10-05 DIAGNOSIS — F411 Generalized anxiety disorder: Secondary | ICD-10-CM

## 2022-10-05 MED ORDER — CLONAZEPAM 0.5 MG PO TABS: 0.5000 mg | ORAL_TABLET | Freq: Three times a day (TID) | ORAL | 2 refills | Status: AC

## 2022-10-18 ENCOUNTER — Other Ambulatory Visit: Payer: Self-pay | Admitting: Family

## 2022-10-18 DIAGNOSIS — F411 Generalized anxiety disorder: Secondary | ICD-10-CM

## 2022-12-10 ENCOUNTER — Other Ambulatory Visit: Payer: Self-pay | Admitting: Neurology

## 2022-12-23 ENCOUNTER — Ambulatory Visit: Payer: Medicaid Other | Admitting: Family

## 2022-12-23 NOTE — Progress Notes (Deleted)
Patient ID: Daniel Trujillo, male    DOB: Dec 02, 1980, 42 y.o.   MRN: [id number]  No chief complaint on file.   HPI: Anxiety/Depression: Patient complains of anxiety disorder.   He has the following symptoms: difficulty concentrating, feelings of losing control, insomnia, irritable, palpitations, racing thoughts, sweating. Onset of symptoms was approximately  years ago, He denies current suicidal and homicidal ideation. Possible organic causes contributing are: drug abuse, neuro. Risk factors: negative life event hx of drug abuse and previous episode of depression Previous treatment includes benzodiazepines Klonopin, Cymbalta, and group therapy.  He complains of the following side effects from the treatment: not as effective, asking to increase to 2mg  qd. Neuropathy: He describes symptoms of numbness, burning, and tingling. Onset of symptoms was about 3 years ago and have stayed the same. Symptoms are currently of moderate severity. Symptoms occur all day and last hours. The patient denies lancinating pain, squeezing, and hypersensitivity. Symptoms are worse in the left lower extremity. He also describes autonomic symptoms of  none. Previous treatment has included Lyrica, which had improved symptoms originally, but stopped working for him. He reports Gabapentin now helps, prescribed through neurology office & Flexeril.  Assessment & Plan:  There are no diagnoses linked to this encounter.  Subjective:    Outpatient Medications Prior to Visit  Medication Sig Dispense Refill   buprenorphine-naloxone (SUBOXONE) 8-2 mg SUBL SL tablet Place under the tongue 2 (two) times daily.     busPIRone (BUSPAR) 5 MG tablet TAKE 1 TABLET BY MOUTH TWICE A DAY 180 tablet 1   clonazePAM (KLONOPIN) 0.5 MG tablet Take 1 tablet (0.5 mg total) by mouth 3 (three) times daily. 90 tablet 2   cyclobenzaprine (FLEXERIL) 10 MG tablet Take 1 tablet (10 mg total) by mouth at bedtime. 30 tablet 2   gabapentin (NEURONTIN) 800  MG tablet TAKE 1 TABLET BY MOUTH 4 TIMES DAILY. 120 tablet 3   No facility-administered medications prior to visit.   Past Medical History:  Diagnosis Date   Elevated liver transaminase level 12/25/2019   Infective myositis of left lower leg    Insomnia    IV drug abuse ([facility])    Left sciatic nerve pain 08/14/2020   Followed by [facility]UR[facility], failed Lyrica, result of extensive IV drug use injected in left leg   Left tibial neuropathy 06/23/2021   Muscle spasm 04/15/2021   Neuropathy of left superficial peroneal nerve 06/23/2021   Opiate dependence ([facility]) 12/25/2019   Paresthesia    Viral hepatitis    No past surgical history on file. Allergies  Allergen Reactions   Naproxen Rash and Swelling    Other reaction(s): Joint Pain   Carbamazepine Hives      Objective:    Physical Exam Vitals and nursing note reviewed.  Constitutional:      General: He is not in acute distress.    Appearance: Normal appearance.  HENT:     Head: Normocephalic.  Cardiovascular:     Rate and Rhythm: Normal rate and regular rhythm.  Pulmonary:     Effort: Pulmonary effort is normal.     Breath sounds: Normal breath sounds.  Musculoskeletal:        General: Normal range of motion.     Cervical back: Normal range of motion.  Skin:    General: Skin is warm and dry.  Neurological:     Mental Status: He is alert and oriented to person, place, and time.  Psychiatric:        Mood  and Affect: Mood normal.    There were no vitals taken for this visit. Wt Readings from Last 3 Encounters:  10/03/22 160 lb (72.6 kg)  07/07/22 161 lb 3.2 oz (73.1 kg)  04/07/22 150 lb (68 kg)       Dulce Sellar, NP

## 2023-01-09 ENCOUNTER — Other Ambulatory Visit: Payer: Self-pay | Admitting: Neurology

## 2023-02-02 ENCOUNTER — Other Ambulatory Visit: Payer: Self-pay | Admitting: Neurology

## 2023-12-11 ENCOUNTER — Ambulatory Visit (INDEPENDENT_AMBULATORY_CARE_PROVIDER_SITE_OTHER): Payer: MEDICAID | Admitting: Infectious Diseases

## 2023-12-11 ENCOUNTER — Other Ambulatory Visit: Payer: Self-pay

## 2023-12-11 ENCOUNTER — Encounter: Payer: Self-pay | Admitting: Infectious Diseases

## 2023-12-11 VITALS — BP 113/72 | HR 62 | Temp 98.4°F | Wt 193.6 lb

## 2023-12-11 DIAGNOSIS — Z8619 Personal history of other infectious and parasitic diseases: Secondary | ICD-10-CM | POA: Diagnosis not present

## 2023-12-11 NOTE — Progress Notes (Signed)
 Patient Name: Donovyn Guidice  Date of Birth: [birth year]  MRN: [id number]  PCP: Versa Gore, NP  Referring Provider: Avonne Boettcher, Ph#: [phone]   Subjective   Subjective:   Chief Complaint  Patient presents with   Follow-up     Discussed the use of AI scribe software for clinical note transcription with the patient, who gave verbal consent to proceed.  History of Present Illness   Jonanthony Nahar is a 43 year old male who presents with concerns about possible hepatitis C reinfection.  He has a history of hepatitis C, successfully treated a couple of years ago. Recently, he used injection drugs approximately two to three months ago and is concerned about potential reinfection. He mentions that his hepatitis C levels are back based on another providers labs, although he is unsure if this is due to the antibody test, which remains positive even after successful treatment.  No abdominal pain, jaundice, or changes in urine or stool. He reports sweating and constipation, which he attributes to methadone use. He manages constipation with a high fiber diet and cereal consumption.  His current medications include methadone, gabapentin , omeprazole, and clonidine. Clonidine helps him sleep well. He has a history of drug use and is currently managing his recovery, acknowledging that 'moments happen' but is focused on ensuring he remains isolated incidents.      ROS  Past Medical History:  Diagnosis Date   Elevated liver transaminase level 12/25/2019   Infective myositis of left lower leg    Insomnia    IV drug abuse ([facility])    Left sciatic nerve pain 08/14/2020   Followed by [facility]UR[facility], failed Lyrica, result of extensive IV drug use injected in left leg   Left tibial neuropathy 06/23/2021   Muscle spasm 04/15/2021   Neuropathy of left superficial peroneal nerve 06/23/2021   Opiate dependence ([facility]) 12/25/2019   Paresthesia    Viral hepatitis     Outpatient  Medications Prior to Visit  Medication Sig Dispense Refill   buprenorphine -naloxone  (SUBOXONE ) 8-2 mg SUBL SL tablet Place under the tongue 2 (two) times daily.     cloNIDine (CATAPRES) 0.1 MG tablet Take 0.1 mg by mouth 2 (two) times daily.     gabapentin  (NEURONTIN ) 800 MG tablet TAKE 1 TABLET BY MOUTH 4 TIMES DAILY. 120 tablet 3   omeprazole (PRILOSEC) 20 MG capsule Take 20 mg by mouth daily as needed.     busPIRone  (BUSPAR ) 5 MG tablet TAKE 1 TABLET BY MOUTH TWICE A DAY (Patient not taking: Reported on 12/11/2023) 180 tablet 1   clonazePAM  (KLONOPIN ) 0.5 MG tablet Take 1 tablet (0.5 mg total) by mouth 3 (three) times daily. (Patient not taking: Reported on 12/11/2023) 90 tablet 2   cyclobenzaprine  (FLEXERIL ) 10 MG tablet Take 1 tablet (10 mg total) by mouth at bedtime. (Patient not taking: Reported on 12/11/2023) 30 tablet 2   No facility-administered medications prior to visit.     Allergies  Allergen Reactions   Naproxen Rash and Swelling    Other reaction(s): Joint Pain   Carbamazepine Hives    Social History   Tobacco Use   Smoking status: Every Day    Types: E-cigarettes   Smokeless tobacco: Never  Substance Use Topics   Alcohol use: Not Currently   Drug use: Yes    Types: Marijuana       Objective   Objective:   Vitals:   12/11/23 1254  BP: 113/72  Pulse: 62  Temp: 98.4 F (36.9  C)  TempSrc: Oral  SpO2: 97%  Weight: 193 lb 9.6 oz (87.8 kg)   Body mass index is 24.86 kg/m.  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 Antibody tests remain positive post-treatment and do not indicate active infection. Reinfection is possible - will need to confirm with labs today. He has been treated twice before, possibly with less effective older medications. If negative RNA level today would recommend repeating Hep C RNA levels not antibody for screening. Could do this twice a year if ongoing risk for injection drug use persists.  He is immune to hepatitis B per 2021 surface antibody titer.  - Order hepatitis C viral load and liver function tests - Review test results and communicate plan by the end of the week   Opioid Use Disorder -  Methadone use - Methadone use for opioid dependence with sweating and constipation as side effects. Sweating is a known side effect. He is aware of the need to manage methadone use and has resources for drug use support.      Orders Placed This Encounter  Procedures   Hepatitis C RNA quantitative ([facility])   Hepatic function panel    No orders of the defined types were placed in this encounter.   No follow-ups on file.  Gibson Kurtz, MSN, NP-C [facility]  [email or web address] Pager: [phone] Office: [phone] [facility] Main Line: [phone] *Secure Chat Communication Welcome

## 2023-12-11 NOTE — Patient Instructions (Addendum)
 Please stop by the lab on your way out to make sure we know if you have new hepatitis C infection or not. If it is positive - will have you back in for more bloodwork so we can work on a plan for re treatment.   I do suspect that someone checked an antibody blood test and not an actual viral level (which is common screening practice).  Will let you know via mychart   Smoking Cessation: QuitlineNC 1-800-QUIT-NOW (947)544-0911); Espaol: 1-855-Djelo-Ya (1-3853469141) http://carroll-castaneda.info/

## 2023-12-13 ENCOUNTER — Ambulatory Visit: Payer: Self-pay | Admitting: Infectious Diseases

## 2023-12-13 DIAGNOSIS — B182 Chronic viral hepatitis C: Secondary | ICD-10-CM

## 2023-12-13 LAB — HEPATIC FUNCTION PANEL
AG Ratio: 1.2 (calc) (ref 1.0–2.5)
ALT: 159 U/L — ABNORMAL HIGH (ref 9–46)
AST: 74 U/L — ABNORMAL HIGH (ref 10–40)
Albumin: 4.3 g/dL (ref 3.6–5.1)
Alkaline phosphatase (APISO): 110 U/L (ref 36–130)
Bilirubin, Direct: 0.1 mg/dL (ref 0.0–0.2)
Globulin: 3.6 g/dL (ref 1.9–3.7)
Indirect Bilirubin: 0.4 mg/dL (ref 0.2–1.2)
Total Bilirubin: 0.5 mg/dL (ref 0.2–1.2)
Total Protein: 7.9 g/dL (ref 6.1–8.1)

## 2023-12-13 LAB — HEPATITIS C RNA QUANTITATIVE
HCV Quantitative Log: 5.17 {Log_IU}/mL — ABNORMAL HIGH
HCV RNA, PCR, QN: 149000 [IU]/mL — ABNORMAL HIGH

## 2023-12-14 ENCOUNTER — Other Ambulatory Visit: Payer: MEDICAID

## 2023-12-14 ENCOUNTER — Other Ambulatory Visit: Payer: Self-pay

## 2023-12-14 DIAGNOSIS — B182 Chronic viral hepatitis C: Secondary | ICD-10-CM

## 2023-12-15 NOTE — Progress Notes (Signed)
 No, I dont think so. Maybe NS5A testing? But if it is a reinfection then probably do not need any. Could consider it more seriously if same genotype as before just in case.

## 2023-12-19 LAB — HEPATITIS C GENOTYPE: HCV Genotype: 3

## 2023-12-21 ENCOUNTER — Other Ambulatory Visit: Payer: MEDICAID

## 2023-12-21 ENCOUNTER — Other Ambulatory Visit: Payer: Self-pay

## 2023-12-21 DIAGNOSIS — Z8619 Personal history of other infectious and parasitic diseases: Secondary | ICD-10-CM

## 2023-12-21 NOTE — Addendum Note (Signed)
 Addended by: Orson Blalock on: 12/21/2023 09:18 AM   Modules accepted: Orders

## 2023-12-29 LAB — HCV VIRAL RNA GEN3 NS5A DRUG RESIST

## 2024-01-01 ENCOUNTER — Other Ambulatory Visit: Payer: Self-pay

## 2024-01-01 ENCOUNTER — Other Ambulatory Visit (HOSPITAL_COMMUNITY): Payer: Self-pay

## 2024-01-01 MED ORDER — SOFOSBUVIR-VELPATASVIR 400-100 MG PO TABS
1.0000 | ORAL_TABLET | Freq: Every day | ORAL | 2 refills | Status: AC
Start: 2024-01-01 — End: ?
  Filled 2024-01-01: qty 28, 28d supply, fill #0

## 2024-01-01 NOTE — Progress Notes (Signed)
 Hep C genotype 3, F1 (reinfection). Resistance testing was negative for Velpatasvir.   Can we try to get epclusa x 12 weeks for him? Rx sent to [facility]

## 2024-01-01 NOTE — Progress Notes (Signed)
 Sounds great - will follow up on counseling once the prescription is filled!

## 2024-01-01 NOTE — Progress Notes (Signed)
 Specialty Pharmacy Initial Fill Coordination Note  Daniel Trujillo is a 43 y.o. male contacted today regarding initial fill of specialty medication(s) Sofosbuvir-Velpatasvir   Patient requested Courier to Provider Office   Delivery date: 01/03/24   Verified address: [address] [place] [address]   Medication will be filled on 01/02/24.   Patient is aware of 4.00 copayment.  Put on AR/ACCT

## 2024-01-01 NOTE — Addendum Note (Signed)
 Addended by: MELVENIA COREAN SAILOR on: 01/01/2024 10:23 AM   Modules accepted: Orders

## 2024-01-02 ENCOUNTER — Other Ambulatory Visit: Payer: Self-pay

## 2024-01-03 ENCOUNTER — Telehealth: Payer: Self-pay

## 2024-01-03 NOTE — Telephone Encounter (Signed)
 [facility] Patient Advocate Encounter  Patient's medications EPCLUSA have been couriered to [facility] from [facility] and will be picked up at [facility].  Kae Heller, CPhT Specialty Pharmacy Patient [facility] Phone: [phone] Fax:  [phone]

## 2024-01-03 NOTE — Progress Notes (Signed)
 FYI - attempted to call patient this morning to discuss Epclusa counseling since medication was sent through [facility] this week. His father answered home phone line. When I asked if I should call his cell phone, his father said to try [facility] because patient is currently incarcerated. Anticipate that he will not be starting this medication right now. Unsure of timeline.

## 2024-01-03 NOTE — Telephone Encounter (Signed)
 Patient currently incarcerated - will reach out to start once available.

## 2024-01-08 ENCOUNTER — Other Ambulatory Visit (HOSPITAL_COMMUNITY): Payer: Self-pay

## 2024-01-09 ENCOUNTER — Ambulatory Visit: Payer: MEDICAID | Admitting: Infectious Diseases

## 2024-01-12 ENCOUNTER — Other Ambulatory Visit: Payer: Self-pay | Admitting: Pharmacist

## 2024-01-12 NOTE — Progress Notes (Signed)
 Patient currently incarcerated and will not start HCV treatment until out.  Alan Geralds, PharmD, CPP, BCIDP, AAHIVP Clinical Pharmacist Practitioner Infectious Diseases Clinical Pharmacist [facility]

## 2024-03-20 NOTE — Progress Notes (Signed)
 FYI - he's still in there. I will leave this a reminder to me every other month to see when he's out. Thanks!
